# Patient Record
Sex: Female | Born: 1980 | Race: White | Hispanic: No
Health system: Southern US, Community
[De-identification: ages and names within clinical notes are randomized; demographics above are authoritative.]

## PROBLEM LIST (undated history)

## (undated) DIAGNOSIS — F419 Anxiety disorder, unspecified: Secondary | ICD-10-CM

## (undated) DIAGNOSIS — E538 Deficiency of other specified B group vitamins: Secondary | ICD-10-CM

## (undated) HISTORY — DX: Anxiety disorder, unspecified: F41.9

## (undated) HISTORY — DX: Deficiency of other specified B group vitamins: E53.8

---

## 2004-07-18 ENCOUNTER — Other Ambulatory Visit: Admission: RE | Admit: 2004-07-18 | Discharge: 2004-07-18 | Payer: Self-pay | Admitting: Obstetrics and Gynecology

## 2004-09-01 ENCOUNTER — Emergency Department (HOSPITAL_COMMUNITY): Admission: EM | Admit: 2004-09-01 | Discharge: 2004-09-02 | Payer: Self-pay | Admitting: Emergency Medicine

## 2005-08-22 ENCOUNTER — Other Ambulatory Visit: Admission: RE | Admit: 2005-08-22 | Discharge: 2005-08-22 | Payer: Self-pay | Admitting: Obstetrics and Gynecology

## 2006-04-20 IMAGING — CR DG FINGER RING 2+V*L*
2 series · 2 of 2 positions shown · non-contrast
Comparison: None.

CLINICAL DATA: Status post trauma.  
 LEFT RING FINGER ? 3 VIEWS:

[view not recorded (1 of 2)]
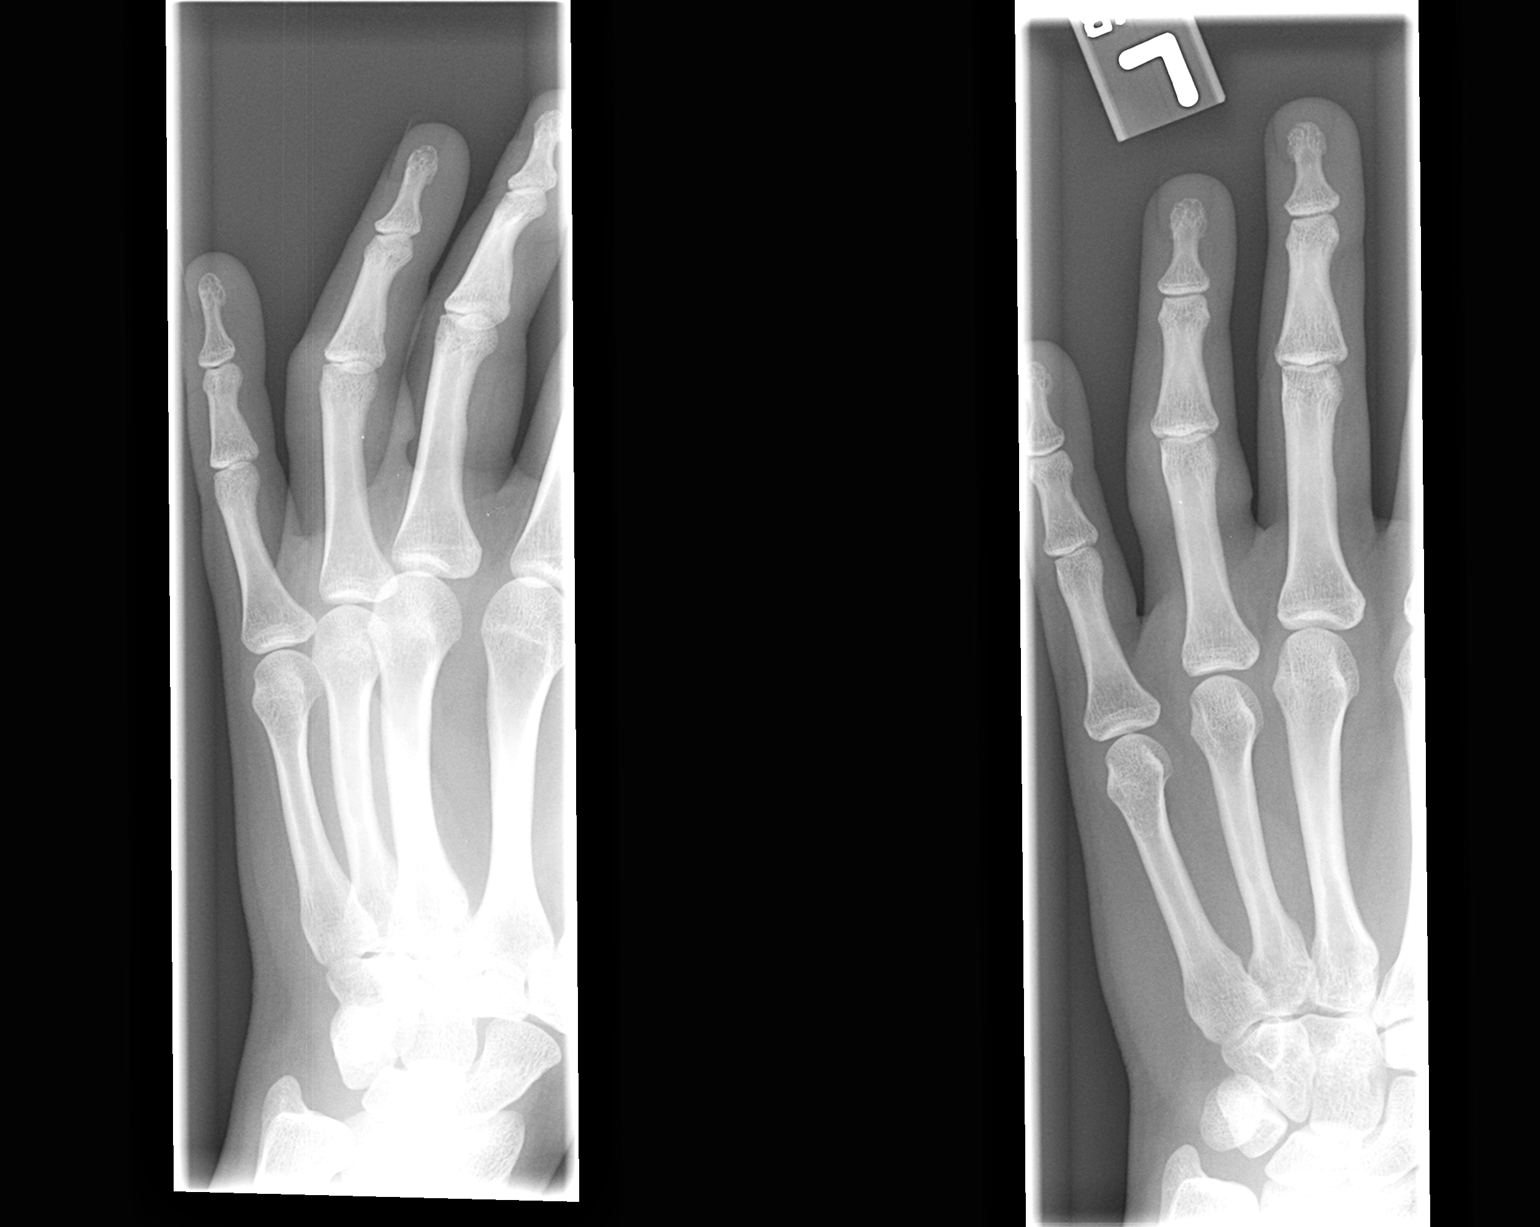

[view not recorded (2 of 2)]
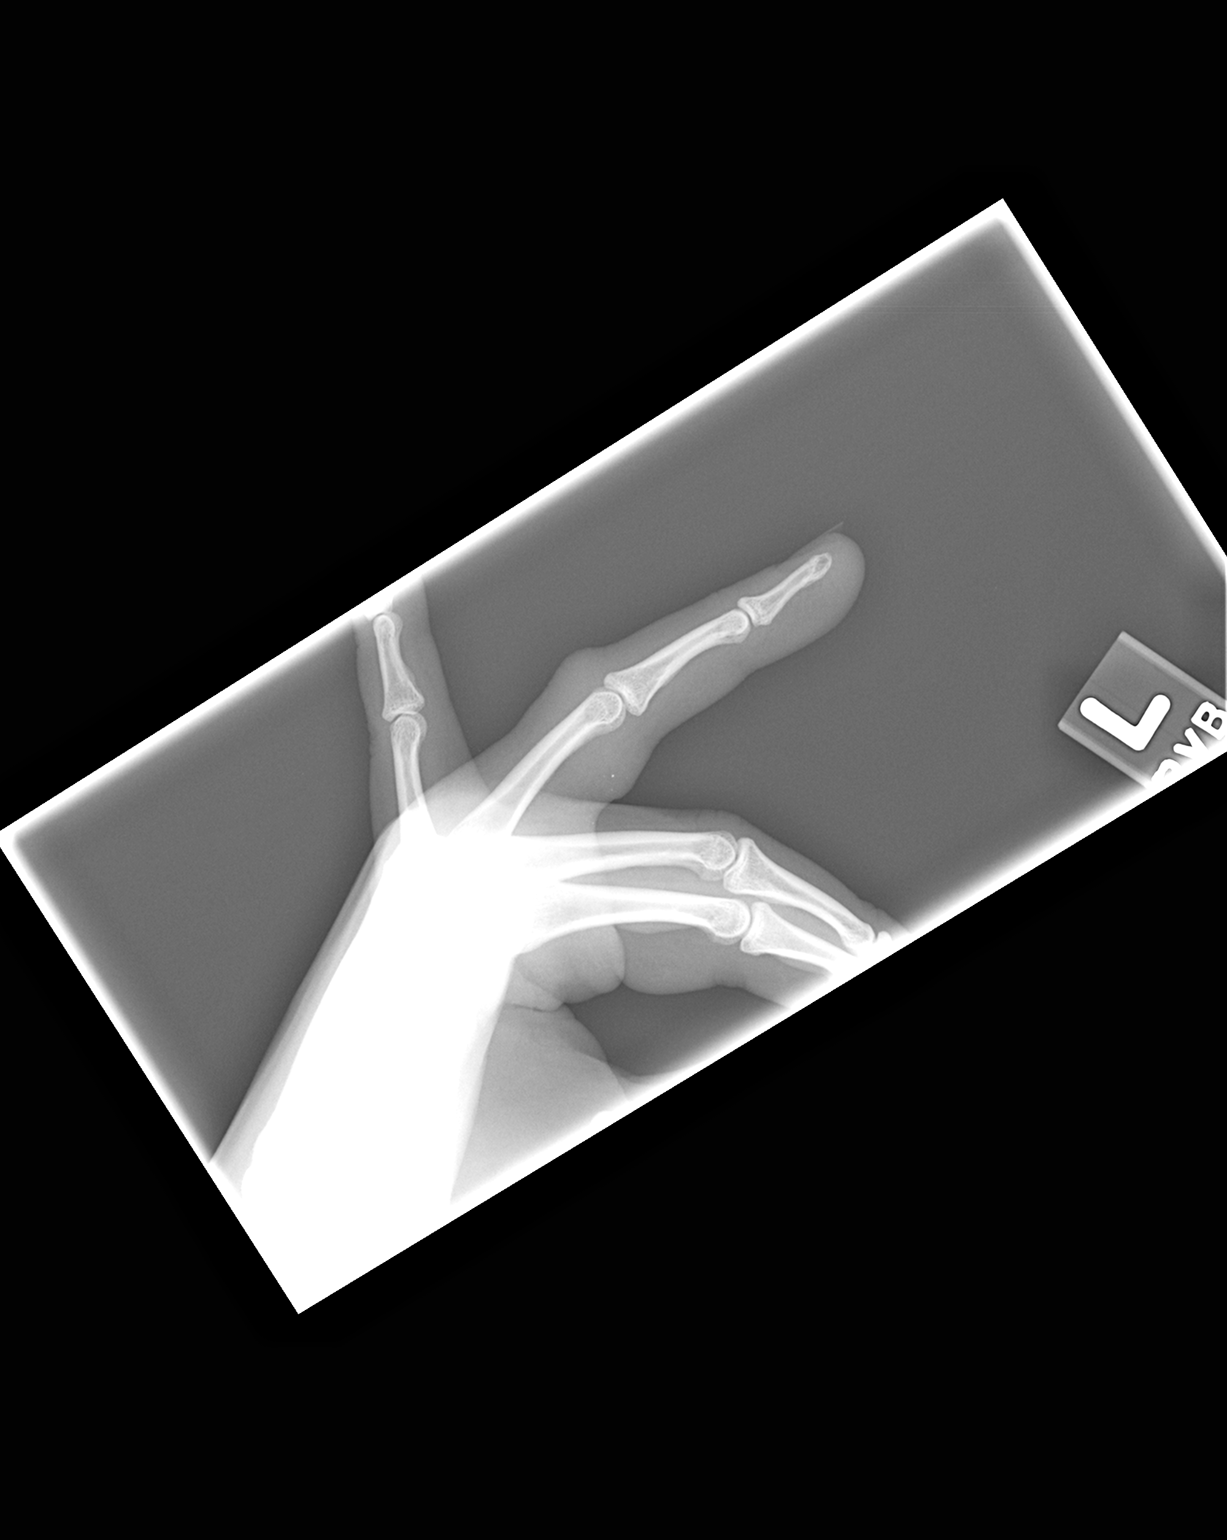

[2 of 2 positions shown; findings below may reference images not displayed]

FINDINGS: There is diffuse soft tissue swelling about the proximal interphalangeal joint of the ring finger.  No underlying fractures or dislocations are identified.
IMPRESSION: Diffuse soft tissue swelling.

## 2007-10-27 ENCOUNTER — Inpatient Hospital Stay (HOSPITAL_COMMUNITY): Admission: AD | Admit: 2007-10-27 | Discharge: 2007-10-30 | Payer: Self-pay | Admitting: Obstetrics and Gynecology

## 2007-10-31 ENCOUNTER — Encounter: Admission: RE | Admit: 2007-10-31 | Discharge: 2007-11-30 | Payer: Self-pay | Admitting: Obstetrics and Gynecology

## 2007-12-01 ENCOUNTER — Encounter: Admission: RE | Admit: 2007-12-01 | Discharge: 2007-12-30 | Payer: Self-pay | Admitting: Obstetrics and Gynecology

## 2007-12-31 ENCOUNTER — Encounter: Admission: RE | Admit: 2007-12-31 | Discharge: 2008-01-30 | Payer: Self-pay | Admitting: Obstetrics and Gynecology

## 2008-01-31 ENCOUNTER — Encounter: Admission: RE | Admit: 2008-01-31 | Discharge: 2008-03-01 | Payer: Self-pay | Admitting: Obstetrics and Gynecology

## 2008-03-02 ENCOUNTER — Encounter: Admission: RE | Admit: 2008-03-02 | Discharge: 2008-03-07 | Payer: Self-pay | Admitting: Obstetrics and Gynecology

## 2009-04-10 ENCOUNTER — Inpatient Hospital Stay (HOSPITAL_COMMUNITY): Admission: RE | Admit: 2009-04-10 | Discharge: 2009-04-12 | Payer: Self-pay | Admitting: Obstetrics and Gynecology

## 2009-04-10 ENCOUNTER — Encounter (INDEPENDENT_AMBULATORY_CARE_PROVIDER_SITE_OTHER): Payer: Self-pay | Admitting: Obstetrics and Gynecology

## 2009-04-13 ENCOUNTER — Encounter: Admission: RE | Admit: 2009-04-13 | Discharge: 2009-05-13 | Payer: Self-pay | Admitting: Obstetrics and Gynecology

## 2009-05-14 ENCOUNTER — Encounter: Admission: RE | Admit: 2009-05-14 | Discharge: 2009-06-08 | Payer: Self-pay | Admitting: Obstetrics and Gynecology

## 2010-04-15 LAB — CBC
HCT: 29 % — ABNORMAL LOW (ref 36.0–46.0)
HCT: 37 % (ref 36.0–46.0)
Hemoglobin: 10.1 g/dL — ABNORMAL LOW (ref 12.0–15.0)
Hemoglobin: 12.8 g/dL (ref 12.0–15.0)
MCV: 90.9 fL (ref 78.0–100.0)
MCV: 92.6 fL (ref 78.0–100.0)
Platelets: 178 10*3/uL (ref 150–400)
WBC: 13.1 10*3/uL — ABNORMAL HIGH (ref 4.0–10.5)

## 2010-04-15 LAB — RPR: RPR Ser Ql: NONREACTIVE

## 2010-06-05 NOTE — Discharge Summary (Signed)
Patty Green, Patty Green                 ACCOUNT NO.:  [id number]   MEDICAL RECORD NO.:  [id number]          PATIENT TYPE:  INP   LOCATION:  9116                          FACILITY:  [facility]   PHYSICIAN:  Zenaida Niece, M.D.DATE OF BIRTH:  [birth year]   DATE OF ADMISSION:  10/27/2007  DATE OF DISCHARGE:  10/30/2007                               DISCHARGE SUMMARY   ADMISSION DIAGNOSIS:  Intrauterine pregnancy at 38 weeks.   DISCHARGE DIAGNOSIS:  Intrauterine pregnancy at 38 weeks.   PROCEDURE:  On October 28, 2007, she had a spontaneous vaginal delivery.   HISTORY AND PHYSICAL:  This is a 30 year old gravida 1, para 0 with an  EGA of 38 plus weeks who presents with complaint of leaking fluid since  0400 on the day of admission.  Evaluation in triage confirmed rupture of  membranes and cervix was 2 cm dilated.  Prenatal care was complicated by  polyhydramnios early, which resolved.   PRENATAL LABS:  Blood type is B positive with negative antibody screen.  Rubella immune.  Hepatitis B surface antigen negative.  RPR nonreactive.  HIV negative.  Gonorrhea and chlamydia negative.  Group B strep is  negative.  Cystic fibrosis negative.  Quad screen is normal.   PAST GYN HISTORY:  History of colposcopy in 2007.   FAMILY HISTORY:  Paternal uncle with spina bifida.   PHYSICAL EXAMINATION:  GENERAL:  She is afebrile with stable vital  signs.  Fetal heart tracing is reactive with irregular contractions.  ABDOMEN:  Gravid, nontender with an estimated fetal weight of 7-1/2  pounds.  Cervix is 2, 80, -1, and ultrasound confirms a vertex  presentation, and she was grossly ruptured.   HOSPITAL COURSE:  The patient was admitted and put on Pitocin.  She  eventually entered active labor and received an epidural.  She  progressed to complete, pushed well, and early on the morning of October 28, 2007, had a vaginal delivery of a viable female infant with Apgars of  8 and 9, weighed 6 pounds 10 ounces.  She  delivered through a loose  nuchal cord x1.  Placenta delivered spontaneously, was intact, and was  sent for cord blood donation.  She had a second-degree laceration,  repaired with 3-0 chromic and 3-0 Vicryl with an estimated blood loss of  500 mL.  Postpartum, she had no significant complications.  She remained  afebrile.  Predelivery hemoglobin 12.4, postdelivery was 8.6.  On  postpartum #2, she was felt to be stable enough for discharge to home.   DISCHARGE INSTRUCTIONS:  Regular diet, pelvic rest, follow up in 6  weeks.   MEDICATIONS:  Over-the-counter ibuprofen as needed and over-the-counter  iron b.i.d., and she is given our discharge pamphlet.      Zenaida Niece, M.D.  Electronically Signed     TDM/MEDQ  D:  10/30/2007  T:  10/30/2007  Job:  [id number]

## 2010-10-23 LAB — CBC
HCT: 37.7
Hemoglobin: 12.4
MCHC: 33.8
MCV: 89.6
Platelets: 167
Platelets: 251
RBC: 2.82 — ABNORMAL LOW

## 2010-10-23 LAB — RPR: RPR Ser Ql: NONREACTIVE

## 2014-10-18 ENCOUNTER — Other Ambulatory Visit: Payer: Self-pay | Admitting: Obstetrics and Gynecology

## 2015-11-28 ENCOUNTER — Other Ambulatory Visit: Payer: Self-pay | Admitting: *Deleted

## 2015-11-28 DIAGNOSIS — D582 Other hemoglobinopathies: Secondary | ICD-10-CM

## 2015-11-29 ENCOUNTER — Encounter: Payer: Self-pay | Admitting: Oncology

## 2015-12-11 ENCOUNTER — Ambulatory Visit (HOSPITAL_BASED_OUTPATIENT_CLINIC_OR_DEPARTMENT_OTHER): Payer: BLUE CROSS/BLUE SHIELD | Admitting: Oncology

## 2015-12-11 ENCOUNTER — Other Ambulatory Visit (HOSPITAL_BASED_OUTPATIENT_CLINIC_OR_DEPARTMENT_OTHER): Payer: BLUE CROSS/BLUE SHIELD

## 2015-12-11 ENCOUNTER — Telehealth: Payer: Self-pay | Admitting: Oncology

## 2015-12-11 VITALS — BP 124/77 | HR 62 | Temp 98.1°F | Resp 18 | Ht 61.0 in | Wt 152.2 lb

## 2015-12-11 DIAGNOSIS — D582 Other hemoglobinopathies: Secondary | ICD-10-CM | POA: Diagnosis not present

## 2015-12-11 DIAGNOSIS — D751 Secondary polycythemia: Secondary | ICD-10-CM | POA: Diagnosis not present

## 2015-12-11 LAB — CBC WITH DIFFERENTIAL/PLATELET
BASO%: 0.4 % (ref 0.0–2.0)
BASOS ABS: 0 10*3/uL (ref 0.0–0.1)
EOS%: 3.3 % (ref 0.0–7.0)
Eosinophils Absolute: 0.2 10*3/uL (ref 0.0–0.5)
HEMATOCRIT: 50.6 % — AB (ref 34.8–46.6)
HGB: 16.8 g/dL — ABNORMAL HIGH (ref 11.6–15.9)
LYMPH#: 2.5 10*3/uL (ref 0.9–3.3)
LYMPH%: 34.8 % (ref 14.0–49.7)
MCH: 29.8 pg (ref 25.1–34.0)
MCHC: 33.3 g/dL (ref 31.5–36.0)
MCV: 89.6 fL (ref 79.5–101.0)
MONO#: 0.5 10*3/uL (ref 0.1–0.9)
MONO%: 6.8 % (ref 0.0–14.0)
NEUT#: 4 10*3/uL (ref 1.5–6.5)
NEUT%: 54.7 % (ref 38.4–76.8)
Platelets: 248 10*3/uL (ref 145–400)
RBC: 5.64 10*6/uL — AB (ref 3.70–5.45)
RDW: 12.5 % (ref 11.2–14.5)
WBC: 7.3 10*3/uL (ref 3.9–10.3)

## 2015-12-11 LAB — CHCC SMEAR

## 2015-12-11 NOTE — Telephone Encounter (Signed)
Appointments scheduled per 11/20 LOS. Patient given AVS report and calendars with future scheduled appointments. °

## 2015-12-11 NOTE — Progress Notes (Signed)
  [facility] New [facility] Consult   Referring MD: Huel CoteKathy Richardson, Md [address] [place], [place] [id number]   Patty Mechanicnna Mcdaris 35 y.o.  Oct 22, 1980    Reason for Referral: Erythrocytosis   HPI: [name] was seen by Dr. Senaida Oresichardson for an annual exam on 11/16/2015. A fingerstick hemoglobin returned elevated at 18.3. A complete CBC from the white count 9.2, hemoglobin 17.9, hematocrit 51.5%, MCV 89, MCHC 34.8, and platelets of 248,000. The absolute neutrophil count returned at 5.8 with an absolute lymphocyte count of 2.4. She reports no previous history of an elevated hemoglobin level. She feels well. No pruritus, rubor, or history of thrombosis.  Past medical history: 1. History of an abnormal Pap smear, CIN 2-status post a Leep procedure 2. G2 P2  Past surgical history: 1. Cesarean section in 2011 Medications: Reviewed  Allergies: No Known Allergies  Family history: No family history of a hematologic condition  Social History:   She lives [place] her [place] and children in [place].  She works at [employer]. She does not use cigarettes. She drinks alcohol on rare occasion. No transfusion history. No risk factor for HIV or hepatitis.   ROS:   Positives include: Sinus/upper respiratory infection when she saw Dr. Senaida Oresichardson in October 2016  A complete ROS was otherwise negative.  Physical Exam:  Blood pressure 124/77, pulse 62, temperature 98.1 F (36.7 C), temperature source Oral, resp. rate 18, height 5\' 1"  (1.549 m), weight 152 lb 3.2 oz (69 kg), SpO2 100 %.  HEENT: Oropharynx without visible mass, neck without mass Lungs: Clear bilaterally Cardiac: Regular rate and rhythm Abdomen: No hepatosplenomegaly, nontender, no mass  Vascular: No leg edema Lymph nodes: No cervical, supraclavicular, axillary, or inguinal nodes Neurologic: Alert and oriented, the motor exam appears intact in the upper and lower extremities Skin: No  rash Musculoskeletal: No spine tenderness   LAB:  CBC  Lab Results  Component Value Date   WBC 7.3 12/11/2015   HGB 16.8 (H) 12/11/2015   HCT 50.6 (H) 12/11/2015   MCV 89.6 12/11/2015   PLT 248 12/11/2015   NEUTROABS 4.0 12/11/2015   Absolute eosinophil count-0.2 Basophils-0.4% Absolute lymphocyte count-2.5     Peripheral blood smear: The platelets are normal in number. No platelet clumps. The white cell morphology is unremarkable. The majority white cells are mature neutrophils. No blasts or other young forms are seen The polychromasia is not increased. The red cell morphology is unremarkable.    Assessment/Plan:   1. Erythrocytosis  Disposition:   [name] is referred for evaluation of erythrocytosis. The hemoglobin and hematocrit are mildly elevated. No evidence for dehydration. There is no apparent secondary cause for erythrocytosis in her case. She does not have symptoms, physical exam findings, or other laboratory evidence of a myeloproliferative disorder.  I discussed the differential diagnosis with her. We will check a JAK-2 mutation, LDH, and erythropoietin level today. She will return for an office visit and CBC in 3 months. We will consider extended mutation testing and further evaluation if she develops a progressive rise in the hematocrit or other hematologic abnormalities.  Approximately 40 minutes were spent with the patient today. The majority of the time was used for counseling and coordination of care.  Thornton PapasSHERRILL, Jartavious Mckimmy, MD  12/11/2015, 3:39 PM

## 2016-03-12 ENCOUNTER — Ambulatory Visit (HOSPITAL_BASED_OUTPATIENT_CLINIC_OR_DEPARTMENT_OTHER): Payer: BLUE CROSS/BLUE SHIELD | Admitting: Oncology

## 2016-03-12 ENCOUNTER — Other Ambulatory Visit (HOSPITAL_BASED_OUTPATIENT_CLINIC_OR_DEPARTMENT_OTHER): Payer: BLUE CROSS/BLUE SHIELD

## 2016-03-12 VITALS — BP 115/77 | HR 63 | Temp 98.6°F | Resp 18 | Ht 61.0 in | Wt 154.7 lb

## 2016-03-12 DIAGNOSIS — D751 Secondary polycythemia: Secondary | ICD-10-CM

## 2016-03-12 LAB — CBC WITH DIFFERENTIAL/PLATELET
BASO%: 0.2 % (ref 0.0–2.0)
BASOS ABS: 0 10*3/uL (ref 0.0–0.1)
EOS ABS: 0.3 10*3/uL (ref 0.0–0.5)
EOS%: 2.7 % (ref 0.0–7.0)
HCT: 42.9 % (ref 34.8–46.6)
HGB: 15.1 g/dL (ref 11.6–15.9)
LYMPH%: 34.7 % (ref 14.0–49.7)
MCH: 30.9 pg (ref 25.1–34.0)
MCHC: 35.2 g/dL (ref 31.5–36.0)
MCV: 87.9 fL (ref 79.5–101.0)
MONO#: 0.8 10*3/uL (ref 0.1–0.9)
MONO%: 8 % (ref 0.0–14.0)
NEUT#: 5.1 10*3/uL (ref 1.5–6.5)
NEUT%: 54.4 % (ref 38.4–76.8)
PLATELETS: 235 10*3/uL (ref 145–400)
RBC: 4.88 10*6/uL (ref 3.70–5.45)
RDW: 12.5 % (ref 11.2–14.5)
WBC: 9.4 10*3/uL (ref 3.9–10.3)
lymph#: 3.3 10*3/uL (ref 0.9–3.3)

## 2016-03-12 NOTE — Progress Notes (Signed)
  [facility] OFFICE PROGRESS NOTE   Diagnosis: Erythrocytosis  INTERVAL HISTORY:   [name] returns as scheduled. She had a "GI bug" a few weeks ago followed by sore throat and cough. The cough persists. No fever or dyspnea. No leg swelling or pain. She denies bleeding and symptoms of thrombosis. No pruritus.  Objective:  Vital signs in last 24 hours:  Blood pressure 115/77, pulse 63, temperature 98.6 F (37 C), temperature source Oral, resp. rate 18, height 5\' 1"  (1.549 m), weight 154 lb 11.2 oz (70.2 kg), SpO2 100 %.   Resp: Mild bilateral end inspiratory wheeze and expiratory rhonchi, no respiratory distress Cardio: Regular rate and rhythm GI: No hepatosplenomegaly Vascular: No leg edema    Lab Results:  Lab Results  Component Value Date   WBC 9.4 03/12/2016   HGB 15.1 03/12/2016   HCT 42.9 03/12/2016   MCV 87.9 03/12/2016   PLT 235 03/12/2016   NEUTROABS 5.1 03/12/2016     Medications: I have reviewed the patient's current medications.  Assessment/Plan: 1. History of Erythrocytosis   Disposition:  [name] was referred last fall for evaluation of an elevated hemoglobin and hematocrit. There was no clinical evidence for a myeloproliferative disorder. We had planned to check a JAK-2 mutation and LDH, but these laboratory studies were not performed. The hemoglobin and hematocrit are normal today. I do not recommend additional testing. I have a low clinical suspicion for a myeloproliferative disorder.  I recommend a CBC be checked at the time of her yearly examination with Dr. Senaida Oresichardson and Dr. Foy GuadalajaraFried. I am available to see her again if the hemoglobin/hematocrit returned elevated.  She will [facility] with [facility] if the cough does not resolve soon.  She is not scheduled for a follow-up appointment in the hematology clinic. I am available to see her in the future as needed.  15 minutes were spent with the patient today. The majority of  the time was used for counseling and coordination of care.  Thornton PapasSHERRILL, Aristide Waggle, MD  03/12/2016  3:56 PM

## 2019-04-15 ENCOUNTER — Telehealth: Payer: Self-pay | Admitting: Nurse Practitioner

## 2019-04-15 ENCOUNTER — Encounter: Payer: Self-pay | Admitting: Nurse Practitioner

## 2019-04-15 NOTE — Telephone Encounter (Signed)
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 Pt has previously seen Dr. Truett Perna in 2018. Pt has been scheduled to see Lonna Cobb on 4/6 at 245pm. I lft the pt a vm to return my call. Letter mailed.

## 2019-04-19 ENCOUNTER — Telehealth: Payer: Self-pay | Admitting: Nurse Practitioner

## 2019-04-19 NOTE — Telephone Encounter (Signed)
Pt cld back to confirm appt w/Lisa on 4/6 at 245pm.

## 2019-04-27 ENCOUNTER — Inpatient Hospital Stay: Payer: BC Managed Care – PPO

## 2019-04-27 ENCOUNTER — Encounter: Payer: Self-pay | Admitting: Nurse Practitioner

## 2019-04-27 ENCOUNTER — Other Ambulatory Visit: Payer: Self-pay

## 2019-04-27 ENCOUNTER — Inpatient Hospital Stay: Payer: BC Managed Care – PPO | Attending: Nurse Practitioner | Admitting: Nurse Practitioner

## 2019-04-27 VITALS — BP 124/92 | HR 75 | Temp 98.6°F | Resp 20 | Ht 61.0 in | Wt 161.7 lb

## 2019-04-27 DIAGNOSIS — D751 Secondary polycythemia: Secondary | ICD-10-CM | POA: Diagnosis present

## 2019-04-27 LAB — LACTATE DEHYDROGENASE: LDH: 154 U/L (ref 98–192)

## 2019-04-27 LAB — CBC WITH DIFFERENTIAL (CANCER CENTER ONLY)
Abs Immature Granulocytes: 0.02 K/uL (ref 0.00–0.07)
Basophils Absolute: 0 K/uL (ref 0.0–0.1)
Basophils Relative: 0 %
Eosinophils Absolute: 0.2 K/uL (ref 0.0–0.5)
Eosinophils Relative: 2 %
HCT: 49.4 % — ABNORMAL HIGH (ref 36.0–46.0)
Hemoglobin: 16.9 g/dL — ABNORMAL HIGH (ref 12.0–15.0)
Immature Granulocytes: 0 %
Lymphocytes Relative: 26 %
Lymphs Abs: 2.5 K/uL (ref 0.7–4.0)
MCH: 31.8 pg (ref 26.0–34.0)
MCHC: 34.2 g/dL (ref 30.0–36.0)
MCV: 92.9 fL (ref 80.0–100.0)
Monocytes Absolute: 0.6 K/uL (ref 0.1–1.0)
Monocytes Relative: 6 %
Neutro Abs: 6.1 K/uL (ref 1.7–7.7)
Neutrophils Relative %: 66 %
Platelet Count: 268 K/uL (ref 150–400)
RBC: 5.32 MIL/uL — ABNORMAL HIGH (ref 3.87–5.11)
RDW: 11.7 % (ref 11.5–15.5)
WBC Count: 9.5 K/uL (ref 4.0–10.5)
nRBC: 0 % (ref 0.0–0.2)

## 2019-04-27 LAB — SAVE SMEAR (SSMR)

## 2019-04-27 NOTE — Progress Notes (Addendum)
  [facility] OFFICE PROGRESS NOTE   Diagnosis: History of erythrocytosis  INTERVAL HISTORY:   Ms. Curnow was last seen by Dr. Truett Perna 02/23/2016 for follow-up of erythrocytosis.  CBC at that time showed hemoglobin and hematocrit in normal range.  No further evaluation was pursued.  Annual CBC recommended.  CBC on 04/09/2019 showed a hemoglobin of 17.6, hematocrit 51.8.  She was referred back to the Cancer [facility].  She feels well.  No symptoms of thrombosis.  No bleeding.  No nausea or vomiting.  Bowels moving regularly.  She has a good appetite.  No fevers or sweats.  She does not smoke.  No shortness of breath.  She is not aware of anyone in her family having an elevated hemoglobin or a bone marrow disorder.  Objective:  Vital signs in last 24 hours:  Blood pressure (!) 124/92, pulse 75, temperature 98.6 F (37 C), temperature source Temporal, resp. rate 20, height 5\' 1"  (1.549 m), weight 161 lb 11.2 oz (73.3 kg), SpO2 100 %.    HEENT: Neck without mass. Lymphatics: No palpable cervical, supraclavicular or axillary lymph nodes. Resp: Lungs clear bilaterally. Cardio: Regular rate and rhythm. GI: Abdomen soft and nontender.  No hepatosplenomegaly. Vascular: No leg edema.  Calves soft and nontender.    Lab Results:  Lab Results  Component Value Date   WBC 9.5 04/27/2019   HGB 16.9 (H) 04/27/2019   HCT 49.4 (H) 04/27/2019   MCV 92.9 04/27/2019   PLT 268 04/27/2019   NEUTROABS 6.1 04/27/2019    Imaging:  No results found.  Medications: I have reviewed the patient's current medications.  Assessment/Plan: 1. History of erythrocytosis  Disposition: Ms. Faivre has a history of erythrocytosis.  We obtained a repeat CBC today.  Hemoglobin/hematocrit elevated, similar to values from 12/11/2015.  White count and platelets in normal range.  We discussed primary and secondary erythrocytosis.  JAK2, LDH and erythropoietin levels are pending.  She will return for a  follow-up CBC in 6 weeks.  Plan for CBC and next appointment in 3 months.  She will contact the office in the interim with any problems.  Patient seen with Dr. 12/13/2015.    Truett Perna ANP/GNP-BC   04/27/2019  4:14 PM This was a shared visit with 06/27/2019.  Ms. Weldin was interviewed and examined.  She has a history of erythrocytosis.  The hemoglobin has not changed significantly over the past 3.5 years.  The etiology of the erythrocytosis is unclear.  I have a low clinical suspicion for polycythemia vera based on the stability of the hemoglobin, normal white count/platelets, and lack of symptoms.  She does not have a history to suggest secondary erythrocytosis due to cardiopulmonary disease.  She does not appear dehydrated or take medications which would cause volume contraction.  We obtained additional diagnostic evaluation today.  The plan is to monitor the hemoglobin for now.  Johna Sheriff, MD

## 2019-04-28 LAB — ERYTHROPOIETIN: Erythropoietin: 3.9 m[IU]/mL (ref 2.6–18.5)

## 2019-04-29 ENCOUNTER — Telehealth: Payer: Self-pay | Admitting: Nurse Practitioner

## 2019-04-29 NOTE — Telephone Encounter (Signed)
Scheduled appt per 4/6 los.  Spoke with pt and she is aware of the appt date and time.

## 2019-05-06 ENCOUNTER — Telehealth: Payer: Self-pay

## 2019-05-06 LAB — JAK2 (INCLUDING V617F AND EXON 12), MPL,& CALR W/RFL MPN PANEL (NGS)

## 2019-05-06 NOTE — Telephone Encounter (Signed)
TC to Pt. Per Roxan Diesel NP relayed message to Pt. About lab results and follow up. Pt. Verbalized understanding, No further problems or concerns noted.

## 2019-05-06 NOTE — Telephone Encounter (Signed)
-----   Message from Rana Snare, NP sent at 05/06/2019  8:51 AM EDT ----- Please let her know labs we obtained at time of her last visit look good. No evidence of a myeloproliferative disorder. Elevated Hb may be a normal variant. F/u as scheduled.

## 2019-06-08 ENCOUNTER — Encounter: Payer: Self-pay | Admitting: *Deleted

## 2019-06-08 ENCOUNTER — Telehealth: Payer: Self-pay | Admitting: Oncology

## 2019-06-08 ENCOUNTER — Inpatient Hospital Stay: Payer: BC Managed Care – PPO

## 2019-06-08 NOTE — Telephone Encounter (Signed)
Scheduled appt per 5/18 sch message - unable to reach pt - left message with appt date and time   

## 2019-06-08 NOTE — Progress Notes (Signed)
Patient left message with after hours [employer] that she needs to reschedule her lab of 5/18 (not feeling well). Sent scheduling message to reschedule for week of 06/14/19

## 2019-06-15 ENCOUNTER — Inpatient Hospital Stay: Payer: BC Managed Care – PPO | Attending: Nurse Practitioner

## 2019-06-15 ENCOUNTER — Other Ambulatory Visit: Payer: Self-pay

## 2019-06-15 DIAGNOSIS — D751 Secondary polycythemia: Secondary | ICD-10-CM | POA: Insufficient documentation

## 2019-06-15 LAB — CBC WITH DIFFERENTIAL (CANCER CENTER ONLY)
Abs Immature Granulocytes: 0.04 10*3/uL (ref 0.00–0.07)
Basophils Absolute: 0 10*3/uL (ref 0.0–0.1)
Basophils Relative: 1 %
Eosinophils Absolute: 0.2 10*3/uL (ref 0.0–0.5)
Eosinophils Relative: 2 %
HCT: 52.5 % — ABNORMAL HIGH (ref 36.0–46.0)
Hemoglobin: 17.6 g/dL — ABNORMAL HIGH (ref 12.0–15.0)
Immature Granulocytes: 1 %
Lymphocytes Relative: 29 %
Lymphs Abs: 1.8 10*3/uL (ref 0.7–4.0)
MCH: 31.5 pg (ref 26.0–34.0)
MCHC: 33.5 g/dL (ref 30.0–36.0)
MCV: 94.1 fL (ref 80.0–100.0)
Monocytes Absolute: 0.4 10*3/uL (ref 0.1–1.0)
Monocytes Relative: 6 %
Neutro Abs: 3.9 10*3/uL (ref 1.7–7.7)
Neutrophils Relative %: 61 %
Platelet Count: 258 10*3/uL (ref 150–400)
RBC: 5.58 MIL/uL — ABNORMAL HIGH (ref 3.87–5.11)
RDW: 12 % (ref 11.5–15.5)
WBC Count: 6.3 10*3/uL (ref 4.0–10.5)
nRBC: 0 % (ref 0.0–0.2)

## 2019-07-27 ENCOUNTER — Inpatient Hospital Stay: Payer: BC Managed Care – PPO | Admitting: Oncology

## 2019-07-27 ENCOUNTER — Inpatient Hospital Stay: Payer: BC Managed Care – PPO

## 2019-07-30 ENCOUNTER — Telehealth: Payer: Self-pay | Admitting: Oncology

## 2019-07-30 ENCOUNTER — Inpatient Hospital Stay (HOSPITAL_BASED_OUTPATIENT_CLINIC_OR_DEPARTMENT_OTHER): Payer: BC Managed Care – PPO | Admitting: Oncology

## 2019-07-30 ENCOUNTER — Other Ambulatory Visit: Payer: Self-pay

## 2019-07-30 ENCOUNTER — Inpatient Hospital Stay: Payer: BC Managed Care – PPO | Attending: Nurse Practitioner

## 2019-07-30 VITALS — BP 124/86 | HR 67 | Temp 97.8°F | Resp 16 | Ht 61.0 in | Wt 159.0 lb

## 2019-07-30 DIAGNOSIS — D751 Secondary polycythemia: Secondary | ICD-10-CM | POA: Diagnosis present

## 2019-07-30 LAB — CBC WITH DIFFERENTIAL (CANCER CENTER ONLY)
Abs Immature Granulocytes: 0.03 10*3/uL (ref 0.00–0.07)
Basophils Absolute: 0 10*3/uL (ref 0.0–0.1)
Basophils Relative: 0 %
Eosinophils Absolute: 0.2 10*3/uL (ref 0.0–0.5)
Eosinophils Relative: 4 %
HCT: 52.5 % — ABNORMAL HIGH (ref 36.0–46.0)
Hemoglobin: 17.7 g/dL — ABNORMAL HIGH (ref 12.0–15.0)
Immature Granulocytes: 1 %
Lymphocytes Relative: 32 %
Lymphs Abs: 2.1 10*3/uL (ref 0.7–4.0)
MCH: 31.6 pg (ref 26.0–34.0)
MCHC: 33.7 g/dL (ref 30.0–36.0)
MCV: 93.8 fL (ref 80.0–100.0)
Monocytes Absolute: 0.4 10*3/uL (ref 0.1–1.0)
Monocytes Relative: 7 %
Neutro Abs: 3.8 10*3/uL (ref 1.7–7.7)
Neutrophils Relative %: 56 %
Platelet Count: 238 10*3/uL (ref 150–400)
RBC: 5.6 MIL/uL — ABNORMAL HIGH (ref 3.87–5.11)
RDW: 12.6 % (ref 11.5–15.5)
WBC Count: 6.6 10*3/uL (ref 4.0–10.5)
nRBC: 0 % (ref 0.0–0.2)

## 2019-07-30 NOTE — Progress Notes (Signed)
  [facility] OFFICE PROGRESS NOTE   Diagnosis: Erythrocytosis  INTERVAL HISTORY:   [name] returns as scheduled. She feels well. No complaint. No dyspnea, pruritus, bleeding, or symptom of thrombosis. She is working at an outdoor nursery.  Objective:  Vital signs in last 24 hours:  Blood pressure 124/86, pulse 67, temperature 97.8 F (36.6 C), temperature source Temporal, resp. rate 16, height 5\' 1"  (1.549 m), weight 159 lb (72.1 kg), SpO2 100 %.    Resp: Lungs clear bilaterally Cardio: Regular rate and rhythm GI: No hepatosplenomegaly Vascular: No leg edema   Lab Results:  Lab Results  Component Value Date   WBC 6.6 07/30/2019   HGB 17.7 (H) 07/30/2019   HCT 52.5 (H) 07/30/2019   MCV 93.8 07/30/2019   PLT 238 07/30/2019   NEUTROABS 3.8 07/30/2019     Medications: I have reviewed the patient's current medications.   Assessment/Plan: 1. History of erythrocytosis  Negative myeloproliferative panel  Appropriately low erythropoietin level    Disposition: [name] has stable erythrocytosis. I have a low clinical suspicion for a myeloproliferative disorder. The white count and platelets are normal and she had a negative myeloproliferative panel in April. The erythropoietin level was appropriately low. There is no family history of erythrocytosis.  She has idiopathic erythrocytosis. The plan is to continue observation. She will call for new symptoms. [name] will return for an office visit and CBC in approximately 6 months.  Johna Sheriff, MD  07/30/2019  8:11 AM

## 2019-07-30 NOTE — Telephone Encounter (Signed)
Scheduled per los. Gave avs and calendar  

## 2020-01-10 ENCOUNTER — Ambulatory Visit: Payer: BC Managed Care – PPO | Admitting: Nurse Practitioner

## 2020-01-10 ENCOUNTER — Other Ambulatory Visit: Payer: BC Managed Care – PPO

## 2022-02-14 DIAGNOSIS — M898X1 Other specified disorders of bone, shoulder: Secondary | ICD-10-CM | POA: Diagnosis not present

## 2022-02-14 DIAGNOSIS — M542 Cervicalgia: Secondary | ICD-10-CM | POA: Diagnosis not present

## 2022-09-05 DIAGNOSIS — Z01419 Encounter for gynecological examination (general) (routine) without abnormal findings: Secondary | ICD-10-CM | POA: Diagnosis not present

## 2022-09-05 DIAGNOSIS — Z1231 Encounter for screening mammogram for malignant neoplasm of breast: Secondary | ICD-10-CM | POA: Diagnosis not present

## 2022-09-10 ENCOUNTER — Other Ambulatory Visit: Payer: Self-pay | Admitting: Obstetrics and Gynecology

## 2022-09-10 DIAGNOSIS — R928 Other abnormal and inconclusive findings on diagnostic imaging of breast: Secondary | ICD-10-CM

## 2022-09-24 ENCOUNTER — Other Ambulatory Visit: Payer: Self-pay | Admitting: Obstetrics and Gynecology

## 2022-09-24 ENCOUNTER — Ambulatory Visit
Admission: RE | Admit: 2022-09-24 | Discharge: 2022-09-24 | Disposition: A | Discharge status: Home / Self Care | Payer: BC Managed Care – PPO | Source: Ambulatory Visit | Attending: Obstetrics and Gynecology | Admitting: Obstetrics and Gynecology

## 2022-09-24 DIAGNOSIS — R928 Other abnormal and inconclusive findings on diagnostic imaging of breast: Secondary | ICD-10-CM

## 2022-09-24 DIAGNOSIS — N6311 Unspecified lump in the right breast, upper outer quadrant: Secondary | ICD-10-CM | POA: Diagnosis not present

## 2022-09-24 DIAGNOSIS — N6310 Unspecified lump in the right breast, unspecified quadrant: Secondary | ICD-10-CM

## 2023-03-28 ENCOUNTER — Ambulatory Visit
Admission: RE | Admit: 2023-03-28 | Discharge: 2023-03-28 | Disposition: A | Discharge status: Home / Self Care | Payer: BC Managed Care – PPO | Source: Ambulatory Visit | Attending: Obstetrics and Gynecology | Admitting: Obstetrics and Gynecology

## 2023-03-28 ENCOUNTER — Other Ambulatory Visit: Payer: Self-pay | Admitting: Obstetrics and Gynecology

## 2023-03-28 DIAGNOSIS — N6310 Unspecified lump in the right breast, unspecified quadrant: Secondary | ICD-10-CM

## 2023-03-28 DIAGNOSIS — N6311 Unspecified lump in the right breast, upper outer quadrant: Secondary | ICD-10-CM | POA: Diagnosis not present

## 2023-04-03 ENCOUNTER — Ambulatory Visit
Admission: RE | Admit: 2023-04-03 | Discharge: 2023-04-03 | Disposition: A | Discharge status: Home / Self Care | Source: Ambulatory Visit | Attending: Obstetrics and Gynecology | Admitting: Obstetrics and Gynecology

## 2023-04-03 DIAGNOSIS — N6311 Unspecified lump in the right breast, upper outer quadrant: Secondary | ICD-10-CM | POA: Diagnosis not present

## 2023-04-03 DIAGNOSIS — N6310 Unspecified lump in the right breast, unspecified quadrant: Secondary | ICD-10-CM

## 2023-04-03 DIAGNOSIS — D241 Benign neoplasm of right breast: Secondary | ICD-10-CM | POA: Diagnosis not present

## 2023-04-03 HISTORY — PX: BREAST BIOPSY: SHX20

## 2023-04-04 LAB — SURGICAL PATHOLOGY

## 2023-09-10 DIAGNOSIS — Z01411 Encounter for gynecological examination (general) (routine) with abnormal findings: Secondary | ICD-10-CM | POA: Diagnosis not present

## 2023-09-10 DIAGNOSIS — Z01419 Encounter for gynecological examination (general) (routine) without abnormal findings: Secondary | ICD-10-CM | POA: Diagnosis not present

## 2023-09-10 DIAGNOSIS — Z1231 Encounter for screening mammogram for malignant neoplasm of breast: Secondary | ICD-10-CM | POA: Diagnosis not present

## 2023-09-10 DIAGNOSIS — N951 Menopausal and female climacteric states: Secondary | ICD-10-CM | POA: Diagnosis not present

## 2023-09-10 DIAGNOSIS — Z130 Encounter for screening for diseases of the blood and blood-forming organs and certain disorders involving the immune mechanism: Secondary | ICD-10-CM | POA: Diagnosis not present

## 2023-10-02 ENCOUNTER — Encounter (INDEPENDENT_AMBULATORY_CARE_PROVIDER_SITE_OTHER): Payer: Self-pay

## 2023-11-04 DIAGNOSIS — Z0289 Encounter for other administrative examinations: Secondary | ICD-10-CM

## 2023-11-04 NOTE — Progress Notes (Unsigned)
 [address] [place], [place], [place] [address] Office: [phone]  /  Fax: [phone]   Initial Consultation    Patty Green was seen in clinic today to evaluate for obesity. She is interested in losing weight to improve overall health and reduce the risk of weight related complications. She presents today to review program treatment options, initial physical assessment, and evaluation.    Anthropometrics and Bioimpedance Analysis   There is no height or weight on file to calculate BMI. Body Fat Mass : *** % Visceral Fat Mass Rating : *** Waist to Height Ratio: ***  Obesity Related Diseases and Complications  Obesity Quality of Life and Psychosocial Complications: {emqolpsychosoc:33006::Reduced health-related quality of life}  Cardiometabolic: {emcardiometabolic complications:33007}  Biomechanical: {embiomechanical:33008}   Weight Related History  She was referred by: {emreferby:28303}  When asked what they would like to accomplish? She states: {EMHopetoaccomplish:28304::Adopt a healthier eating pattern and lifestyle,Improve energy levels and physical activity,Improve existing medical conditions,Improve quality of life}  Weight history: ***  Highest weight: ***  Contributing factors: {EMcontributingfactors:28307}  Prior weight loss attempts: {emweightlossprograms:31590::None}  Current or previous pharmacotherapy: {EM previousRx:28311}  Response to medication: {EMResponsetomedication:28312}  Current nutrition plan: {EMNutritionplan:28309::None}  Greatest challenge with dieting: {emgreatestchallengediet:31593}.  Current level of physical activity: {EMcurrentPA:28310::None}  Barriers to Exercise: {embarrierstoexercise:32606::no barriers}  Readiness and Motivation  On a scale from 0 to 10 How ready are you to make changes to your eating and physical activity to lose weight? {NUMBER 1-10:22536} How important is it for you to lose weight right now  ? {NUMBER 1-10:22536} How confident are you that you can lose weight if you try? {NUMBER [id number]  Past Medical History  No past medical history on file.   Objective    There were no vitals taken for this visit. She was weighed on the bioimpedance scale: There is no height or weight on file to calculate BMI.    General:  Alert, oriented and cooperative. Patient is in no acute distress.  Respiratory: Normal respiratory effort, no problems with respiration noted   Gait: able to ambulate independently  Mental Status: Normal mood and affect. Normal behavior. Normal judgment and thought content.   Diagnostic Data Reviewed  BMET No results found for: NA, K, CL, CO2, GLUCOSE, BUN, CREATININE, CALCIUM, GFRNONAA, GFRAA No results found for: HGBA1C No results found for: INSULIN CBC    Component Value Date/Time   WBC 6.6 07/30/2019 0735   WBC 9.4 03/12/2016 1454   WBC 13.1 (H) 04/11/2009 0555   RBC 5.60 (H) 07/30/2019 0735   HGB 17.7 (H) 07/30/2019 0735   HGB 15.1 03/12/2016 1454   HCT 52.5 (H) 07/30/2019 0735   HCT 42.9 03/12/2016 1454   PLT 238 07/30/2019 0735   PLT 235 03/12/2016 1454   MCV 93.8 07/30/2019 0735   MCV 87.9 03/12/2016 1454   MCH 31.6 07/30/2019 0735   MCHC 33.7 07/30/2019 0735   RDW 12.6 07/30/2019 0735   RDW 12.5 03/12/2016 1454   Iron/TIBC/Ferritin/ %Sat No results found for: IRON, TIBC, FERRITIN, IRONPCTSAT Lipid Panel  No results found for: CHOL, TRIG, HDL, CHOLHDL, VLDL, LDLCALC, LDLDIRECT Hepatic Function Panel  No results found for: PROT, ALBUMIN, AST, ALT, ALKPHOS, BILITOT, BILIDIR, IBILI No results found for: TSH  Medications  Outpatient Encounter Medications as of 11/05/2023  Medication Sig Note   Cholecalciferol 10 MCG (400 UNIT) CAPS Take 10 mcg by mouth daily.    levonorgestrel (MIRENA, 52 MG,) 20 MCG/24HR IUD Mirena 20 mcg/24 hours (6 yrs) 52 mg intrauterine device  Take 1  device every day by intrauterine route. 07/30/2019: Placed 2021   Multiple Vitamin (MULTIVITAMIN) tablet Take 1 tablet by mouth daily.    No facility-administered encounter medications on file as of 11/05/2023.     Assessment and Plan   There are no diagnoses linked to this encounter.     Obesity Treatment and Action Plan:  {EMobesityactionplanscribe:28314::Patient will work on garnering support from family and friends to begin weight loss journey.,Will work on eliminating or reducing the presence of highly palatable, calorie dense foods in the home.,Will complete provided nutritional and psychosocial assessment questionnaire before the next appointment.,Will be scheduled for indirect calorimetry to determine resting energy expenditure in a fasting state.  This will allow us  to create a reduced calorie, high-protein meal plan to promote loss of fat mass while preserving muscle mass.,Counseled on the health benefits of losing 5%-15% of total body weight.,Was counseled on nutritional approaches to weight loss and benefits of reducing processed foods and consuming plant-based foods and high quality protein as part of nutritional weight management.,Was counseled on pharmacotherapy and role as an adjunct in weight management. }  Education and Additional resources  She was weighed on the bioimpedance scale and results were discussed and documented in the synopsis.  We discussed obesity as a progressive, chronic disease and the importance of a more detailed evaluation of all the factors contributing to the disease.  We reviewed the basic principles in obesity management.   We discussed the importance of long term lifestyle changes which include nutrition, exercise and behavioral modification as well as the importance of customizing this to her specific health and social needs.  We reviewed the role of medical interventions including pharmacotherapy and surgical interventions.   We  discussed the benefits of reaching a healthier weight to alleviate the symptoms of existing conditions and reduce the risks of the biomechanical, cardiometabolic and psychological effects of obesity.  We reviewed our program approach and philosophy, which are guided by the four pillars of obesity medicine.  We discussed how to prepare for intake appointment and the importance of fasting and avoidance of stimulants for at least 8 hours prior to indirect calorimetry.  Akayla Brass appears to be in the action stage of change and reports being ready to initiate intensive lifestyle and behavioral modifications as part of their weight loss journey.  Attestation  Reviewed by clinician on day of visit: allergies, medications, problem list, medical history, surgical history, family history, social history, and previous encounter notes pertinent to obesity diagnosis.  I have spent *** minutes in the care of the patient today including: {NUMBER 1-10:22536} minutes before the visit reviewing and preparing the chart. *** minutes face-to-face {emfacetoface:32598::assessing and reviewing listed medical problems as outlined in obesity care plan,providing nutritional and behavioral counseling on topics outlined in the obesity care plan,independently interpreting test results and goals of care, as described in assessment and plan,reviewing and discussing biometric information and progress} {NUMBER 1-10:22536} minutes after the visit updating chart and documentation of encounter.  Alzora Ha ANP-C

## 2023-11-05 ENCOUNTER — Encounter (INDEPENDENT_AMBULATORY_CARE_PROVIDER_SITE_OTHER): Payer: Self-pay | Admitting: Nurse Practitioner

## 2023-11-05 ENCOUNTER — Ambulatory Visit (INDEPENDENT_AMBULATORY_CARE_PROVIDER_SITE_OTHER): Admitting: Nurse Practitioner

## 2023-11-05 VITALS — BP 117/82 | HR 73 | Ht 62.0 in | Wt 175.0 lb

## 2023-11-05 DIAGNOSIS — E66811 Obesity, class 1: Secondary | ICD-10-CM | POA: Diagnosis not present

## 2023-11-05 DIAGNOSIS — Z6832 Body mass index (BMI) 32.0-32.9, adult: Secondary | ICD-10-CM | POA: Diagnosis not present

## 2023-11-05 DIAGNOSIS — D751 Secondary polycythemia: Secondary | ICD-10-CM

## 2023-11-26 ENCOUNTER — Ambulatory Visit (INDEPENDENT_AMBULATORY_CARE_PROVIDER_SITE_OTHER): Payer: Self-pay | Admitting: Nurse Practitioner

## 2023-11-26 ENCOUNTER — Encounter (INDEPENDENT_AMBULATORY_CARE_PROVIDER_SITE_OTHER): Payer: Self-pay | Admitting: Nurse Practitioner

## 2023-11-26 VITALS — BP 118/78 | HR 78 | Temp 98.1°F | Ht 62.0 in | Wt 177.0 lb

## 2023-11-26 DIAGNOSIS — Z1331 Encounter for screening for depression: Secondary | ICD-10-CM

## 2023-11-26 DIAGNOSIS — R0602 Shortness of breath: Secondary | ICD-10-CM | POA: Diagnosis not present

## 2023-11-26 DIAGNOSIS — E66811 Obesity, class 1: Secondary | ICD-10-CM

## 2023-11-26 DIAGNOSIS — D751 Secondary polycythemia: Secondary | ICD-10-CM | POA: Diagnosis not present

## 2023-11-26 DIAGNOSIS — R5383 Other fatigue: Secondary | ICD-10-CM

## 2023-11-26 DIAGNOSIS — E559 Vitamin D deficiency, unspecified: Secondary | ICD-10-CM

## 2023-11-26 DIAGNOSIS — Z6832 Body mass index (BMI) 32.0-32.9, adult: Secondary | ICD-10-CM

## 2023-11-26 DIAGNOSIS — T733XXA Exhaustion due to excessive exertion, initial encounter: Secondary | ICD-10-CM | POA: Insufficient documentation

## 2023-11-26 NOTE — Progress Notes (Signed)
 [address],  [place], [place] [address]  Office: [phone]  /  Fax: [phone]   Subjective   Initial Visit  Patty Green (MR# [id number]) is a 43 y.o. female who presents for evaluation and treatment of obesity and related comorbidities. Current BMI is Body mass index is 32.37 kg/m. Meshawn has been struggling with her weight for many years and has been unsuccessful in either losing weight, maintaining weight loss, or reaching her healthy weight goal.  Maryela is currently in the action stage of change and ready to dedicate time achieving and maintaining a healthier weight. Bhumi is interested in becoming our patient and working on intensive lifestyle modifications including (but not limited to) diet and exercise for weight loss.  Madden went through a divorce in 2021 and lost weight and was able to keep it off until 2 years ago when she started to gradually gain weight and has not been able to lose weight. She has tried intermittent fasting, keto and portion control. She works at a [employer] which has 80 acres and she is very active at work getting 76999 steps a day. She does not do regular exercise other than work. She denies related health conditions.   She has previously been followed through hematology for erythrocytosis. She is under observation only for the condition.  Weight history:  When asked how their weight has affected their life and health, she states: Has affected self-esteem, Having fatigue, and Having poor endurance  When asked what else they would like to accomplish? She states: Adopt a healthier eating pattern and lifestyle, Improve energy levels and physical activity, Improve existing medical conditions, Improve quality of life, Improve appearance, and Improve self-confidence  She starting to note weight gain during : In the past 2 years with perimenopause.  Life events associated with weight gain include : single parent. .   Other contributing factors: use of  obesogenic medications: Contraceptives or hormonal therapy, moderate to high levels of stress, chronic skipping of meals, hectic pace of life, need for convenient foods, and self - critic or all-or-none mindset.  Their highest weight has been:  176 lbs.  Desired weight: 130  Previous weight-loss programs : Ketogenic, Balanced Plate / Portion Control, and Intermittent fasting.  Their maximum weight loss was:  30 lbs. After divorce  Their greatest challenge with dieting: no weight loss and dieting fatigue.  Current or previous pharmacotherapy: None and Is interested in pharmacotherapy.  Response to medication: Never tried medications   Nutritional History:  Current nutrition plan: None.  How many times do you eat outside the home: 2-4 per week  How often do they skip meals: skips breakfast, skips lunch, and skips dinner- depends on the day which meal she will skip  What beverages do they drink: water, diet soda , and alcohol( wine and beer, few glasses a week and on the weekend)   Use of artificial sweetners : No  Food intolerances or dislikes: fish and green peas  Food triggers: Stress and Boredom.  Food cravings: None  Do they struggle with excessive hunger or portion control : No    Physical Activity:  Current level of physical activity: Walking 5 days a week at work and Step counting 76999 at work  Barriers to Exercise: time and energy   Past medical history includes:   Past Medical History:  Diagnosis Date   Anxiety    Vitamin B 12 deficiency      Objective   BP 118/78   Pulse  78   Temp 98.1 F (36.7 C)   Ht 5' 2 (1.575 m)   Wt 177 lb (80.3 kg)   SpO2 98%   BMI 32.37 kg/m  She was weighed on the bioimpedance scale: Body mass index is 32.37 kg/m.    Anthropometrics:  Vitals Temp: 98.1 F (36.7 C) BP: 118/78 Pulse Rate: 78 SpO2: 98 %   Anthropometric Measurements Height: 5' 2 (1.575 m) Weight: 177 lb (80.3 kg) BMI (Calculated):  32.37 Peak Weight: 177 lb Waist Measurement : 37 inches   Body Composition  Body Fat %: 39.2 % Fat Mass (lbs): 69.9 lbs Muscle Mass (lbs): 102.4 lbs Total Body Water (lbs): 69.8 lbs Visceral Fat Rating : 9   Other Clinical Data Fasting: yes Labs: yes Today's Visit #: 1 Starting Date: 11/26/23    Physical Exam:  General: She is overweight, cooperative, alert, well developed, and in no acute distress. PSYCH: Has normal mood, affect and thought process.   HEENT: EOMI, sclerae are anicteric. Lungs: Normal breathing effort, no conversational dyspnea. Extremities: No edema.  Neurologic: No gross sensory or motor deficits. No tremors or fasciculations noted.    Diagnostic Data Reviewed  EKG: Normal sinus rhythm, rate 69. No conduction abnormalities, abnormal Q waves or chamber enlargement.  Indirect Calorimeter completed today shows a VO2 of 241 and a REE of 1656.  Her calculated basal metabolic rate is 8505 thus her resting energy expenditure same as calculated.  Depression Screen  Charolett's PHQ-9 score was: 10.     11/26/2023    8:10 AM  Depression screen PHQ 2/9  Decreased Interest 0  Down, Depressed, Hopeless 1  PHQ - 2 Score 1  Altered sleeping 2  Tired, decreased energy 3  Change in appetite 2  Feeling bad or failure about yourself  1  Trouble concentrating 1  Moving slowly or fidgety/restless 0  Suicidal thoughts 0  PHQ-9 Score 10  Difficult doing work/chores Not difficult at all    Screening for Sleep Related Breathing Disorders  Maebel admits to daytime somnolence and admits to waking up still tired. Patient has a history of symptoms of daytime fatigue. Juli generally gets 4-6 hours of sleep per night, and states that she has nightime awakenings and difficulty falling back asleep if awakened. Snoring is not present. Apneic episodes are not present. Epworth Sleepiness Score is 16.   BMET No results found for: NA, K, CL, CO2, GLUCOSE, BUN,  CREATININE, CALCIUM, GFRNONAA, GFRAA No results found for: HGBA1C No results found for: INSULIN CBC    Component Value Date/Time   WBC 6.6 07/30/2019 0735   WBC 9.4 03/12/2016 1454   WBC 13.1 (H) 04/11/2009 0555   RBC 5.60 (H) 07/30/2019 0735   HGB 17.7 (H) 07/30/2019 0735   HGB 15.1 03/12/2016 1454   HCT 52.5 (H) 07/30/2019 0735   HCT 42.9 03/12/2016 1454   PLT 238 07/30/2019 0735   PLT 235 03/12/2016 1454   MCV 93.8 07/30/2019 0735   MCV 87.9 03/12/2016 1454   MCH 31.6 07/30/2019 0735   MCHC 33.7 07/30/2019 0735   RDW 12.6 07/30/2019 0735   RDW 12.5 03/12/2016 1454   Iron/TIBC/Ferritin/ %Sat No results found for: IRON, TIBC, FERRITIN, IRONPCTSAT Lipid Panel  No results found for: CHOL, TRIG, HDL, CHOLHDL, VLDL, LDLCALC, LDLDIRECT Hepatic Function Panel  No results found for: PROT, ALBUMIN, AST, ALT, ALKPHOS, BILITOT, BILIDIR, IBILI No results found for: TSH   Assessment and Plan   TREATMENT PLAN FOR OBESITY:  Recommended Dietary  Goals  Anjelina is currently in the action stage of change. As such, her goal is to implement medically supervised obesity management plan.  She has agreed to implement: the Category 1 plan - 1000 kcal per day  Behavioral Intervention  We discussed the following Behavioral Modification Strategies today: increasing lean protein intake to established goals, decreasing simple carbohydrates , increasing vegetables, increasing lower glycemic fruits, increasing fiber rich foods, avoiding skipping meals, increasing water intake, work on meal planning and preparation, reading food labels , keeping healthy foods at home, identifying sources and decreasing liquid calories, decreasing eating out or consumption of processed foods, and making healthy choices when eating convenient foods, planning for success, and better snacking choices  Additional resources provided today: Handout on healthy eating and  balanced plate, Handout on complex carbohydrates and lean sources of protein, Category 1 packet, and Handout principles of weight management  Recommended Physical Activity Goals  Oza has been advised to work up to 150 minutes of moderate intensity aerobic activity a week and strengthening exercises 2-3 times per week for cardiovascular health, weight loss maintenance and preservation of muscle mass.   She has agreed to :  Continue current level of physical activity , Think about enjoyable ways to increase daily physical activity and overcoming barriers to exercise, and Increase physical activity in their day and reduce sedentary time (increase NEAT).  Medical Interventions and Pharmacotherapy We will work on building a therapist, art and behavioral strategies. We will discuss the role of pharmacotherapy as an adjunct at subsequent visits.   ASSOCIATED CONDITIONS ADDRESSED TODAY  Other Fatigue  Jnaya does feel that her weight is causing her energy to be lower than it should be. Fatigue may be related to obesity, depression or many other causes. Labs will be ordered, and in the meanwhile, Candita will focus on self care including making healthy food choices, increasing physical activity and focusing on stress reduction. - EKG 12 lead  Shortness of Breath Annjanette notes increasing shortness of breath with physical activity and seems to be worsening over time with weight gain. She notes getting out of breath sooner with activity than she used to. This has not gotten worse recently. Yeny denies shortness of breath at rest or orthopnea.   Depression screening Scored a 10 but does not feel depressed or feel the need for medication at this time  Vitamin D deficiency If Vit D level remains low will supplement with Ergocalciferol 50000 units once a week for 12 weeks and then recheck level.   -     VITAMIN D 25 Hydroxy (Vit-D Deficiency, Fractures)  Erythrocytosis(idiopathic) Recheck  CBC to make sure hemoglobin/hematocrit/RBC are not excessively elevated -     CBC with Differential/Platelet  Class 1 obesity without serious comorbidity with body mass index (BMI) of 32.0 to 32.9 in adult, unspecified obesity type See plan above -     CBC with Differential/Platelet -     Comprehensive metabolic panel with GFR -     Hemoglobin A1c -     Insulin, random -     Lipid panel -     Magnesium -     TSH -     Vitamin B12 -     VITAMIN D 25 Hydroxy (Vit-D Deficiency, Fractures)    Follow-up  She was informed of the importance of frequent follow-up visits to maximize her success with intensive lifestyle modifications for her multiple health conditions. She was informed we would discuss her lab results at her  next visit unless there is a critical issue that needs to be addressed sooner. Seriah agreed to keep her next visit at the agreed upon time to discuss these results.  Attestation Statement  This is the patient's intake visit at [facility]. The patient's Health Questionnaire was reviewed at length. Included in the packet: current and past health history, medications, allergies, ROS, gynecologic history (women only), surgical history, family history, social history, weight history, weight loss surgery history (for those that have had weight loss surgery), nutritional evaluation, mood and food questionnaire, PHQ9, Epworth questionnaire, sleep habits questionnaire, patient life and health improvement goals questionnaire. These will all be scanned into the patient's chart under media.   During the visit, I independently reviewed the patient's EKG, previous labs, bioimpedance scale results, and indirect calorimetry results. I used this information to medically tailor a meal plan for the patient that will help her to lose weight and will improve her obesity-related conditions. I performed a medically necessary appropriate examination and/or evaluation. I discussed the  assessment and treatment plan with the patient. The patient was provided an opportunity to ask questions and all were answered. The patient agreed with the plan and demonstrated an understanding of the instructions. Labs were ordered at this visit and will be reviewed at the next visit unless critical results need to be addressed immediately. Clinical information was updated and documented in the EMR.   In addition, they received basic education on identification of processed foods and reduction of these, different sources of lean proteins and complex carbohydrates and how to eat balanced by incorporation of whole foods.  Reviewed by clinician on day of visit: allergies, medications, problem list, medical history, surgical history, family history, social history, and previous encounter notes.  I personally spent a total of 40 minutes in the care of the patient today including preparing to see the patient, getting/reviewing separately obtained history, performing a medically appropriate exam/evaluation, counseling and educating, placing orders, and documenting clinical information in the EHR. Excluding time for indirect calorimetry, EKG and depression screening   Lonell Liverpool ANP-C

## 2023-11-27 ENCOUNTER — Ambulatory Visit (INDEPENDENT_AMBULATORY_CARE_PROVIDER_SITE_OTHER): Payer: Self-pay | Admitting: Nurse Practitioner

## 2023-11-27 LAB — LIPID PANEL
Chol/HDL Ratio: 4.9 ratio — ABNORMAL HIGH (ref 0.0–4.4)
Cholesterol, Total: 210 mg/dL — ABNORMAL HIGH (ref 100–199)
HDL: 43 mg/dL (ref >39)
LDL Chol Calc (NIH): 137 mg/dL — ABNORMAL HIGH (ref 0–99)
Triglycerides: 168 mg/dL — ABNORMAL HIGH (ref 0–149)
VLDL Cholesterol Cal: 30 mg/dL (ref 5–40)

## 2023-11-27 LAB — COMPREHENSIVE METABOLIC PANEL WITH GFR
ALT: 32 IU/L (ref 0–32)
AST: 56 IU/L — ABNORMAL HIGH (ref 0–40)
Albumin: 4.5 g/dL (ref 3.9–4.9)
Alkaline Phosphatase: 100 IU/L (ref 41–116)
BUN/Creatinine Ratio: 17 (ref 9–23)
BUN: 12 mg/dL (ref 6–24)
Bilirubin Total: 0.9 mg/dL (ref 0.0–1.2)
CO2: 24 mmol/L (ref 20–29)
Calcium: 9.7 mg/dL (ref 8.7–10.2)
Chloride: 101 mmol/L (ref 96–106)
Creatinine, Ser: 0.72 mg/dL (ref 0.57–1.00)
Globulin, Total: 2.5 g/dL (ref 1.5–4.5)
Glucose: 88 mg/dL (ref 70–99)
Potassium: 4.5 mmol/L (ref 3.5–5.2)
Sodium: 137 mmol/L (ref 134–144)
Total Protein: 7 g/dL (ref 6.0–8.5)
eGFR: 107 mL/min/1.73 (ref >59)

## 2023-11-27 LAB — HEMOGLOBIN A1C
Est. average glucose Bld gHb Est-mCnc: 105 mg/dL
Hgb A1c MFr Bld: 5.3 % (ref 4.8–5.6)

## 2023-11-27 LAB — CBC WITH DIFFERENTIAL/PLATELET
Basophils Absolute: 0 x10E3/uL (ref 0.0–0.2)
Basos: 1 %
EOS (ABSOLUTE): 0.2 x10E3/uL (ref 0.0–0.4)
Eos: 2 %
Hematocrit: 49.9 % — ABNORMAL HIGH (ref 34.0–46.6)
Hemoglobin: 16.3 g/dL — ABNORMAL HIGH (ref 11.1–15.9)
Immature Grans (Abs): 0.1 x10E3/uL (ref 0.0–0.1)
Immature Granulocytes: 1 %
Lymphocytes Absolute: 2 x10E3/uL (ref 0.7–3.1)
Lymphs: 23 %
MCH: 30.6 pg (ref 26.6–33.0)
MCHC: 32.7 g/dL (ref 31.5–35.7)
MCV: 94 fL (ref 79–97)
Monocytes Absolute: 0.4 x10E3/uL (ref 0.1–0.9)
Monocytes: 5 %
Neutrophils Absolute: 5.7 x10E3/uL (ref 1.4–7.0)
Neutrophils: 68 %
Platelets: 272 x10E3/uL (ref 150–450)
RBC: 5.32 x10E6/uL — ABNORMAL HIGH (ref 3.77–5.28)
RDW: 12 % (ref 11.7–15.4)
WBC: 8.4 x10E3/uL (ref 3.4–10.8)

## 2023-11-27 LAB — INSULIN, RANDOM: INSULIN: 12.8 u[IU]/mL (ref 2.6–24.9)

## 2023-11-27 LAB — VITAMIN B12: Vitamin B-12: 378 pg/mL (ref 232–1245)

## 2023-11-27 LAB — VITAMIN D 25 HYDROXY (VIT D DEFICIENCY, FRACTURES): Vit D, 25-Hydroxy: 19.2 ng/mL — ABNORMAL LOW (ref 30.0–100.0)

## 2023-11-27 LAB — TSH: TSH: 1.93 u[IU]/mL (ref 0.450–4.500)

## 2023-11-27 LAB — MAGNESIUM: Magnesium: 2.3 mg/dL (ref 1.6–2.3)

## 2023-12-10 ENCOUNTER — Encounter (INDEPENDENT_AMBULATORY_CARE_PROVIDER_SITE_OTHER): Payer: Self-pay | Admitting: Nurse Practitioner

## 2023-12-10 ENCOUNTER — Ambulatory Visit (INDEPENDENT_AMBULATORY_CARE_PROVIDER_SITE_OTHER): Payer: Self-pay | Admitting: Nurse Practitioner

## 2023-12-10 VITALS — BP 122/81 | HR 70 | Temp 98.1°F | Ht 62.0 in | Wt 175.0 lb

## 2023-12-10 DIAGNOSIS — E559 Vitamin D deficiency, unspecified: Secondary | ICD-10-CM

## 2023-12-10 DIAGNOSIS — E782 Mixed hyperlipidemia: Secondary | ICD-10-CM

## 2023-12-10 DIAGNOSIS — Z6832 Body mass index (BMI) 32.0-32.9, adult: Secondary | ICD-10-CM

## 2023-12-10 DIAGNOSIS — D751 Secondary polycythemia: Secondary | ICD-10-CM | POA: Diagnosis not present

## 2023-12-10 DIAGNOSIS — E66811 Obesity, class 1: Secondary | ICD-10-CM | POA: Diagnosis not present

## 2023-12-10 MED ORDER — VITAMIN D (ERGOCALCIFEROL) 1.25 MG (50000 UNIT) PO CAPS: 50000.0000 [IU] | ORAL_CAPSULE | ORAL | 1 refills | Status: DC

## 2023-12-10 NOTE — Progress Notes (Signed)
 Office: [phone]  /  Fax: [phone]  WEIGHT SUMMARY AND BIOMETRICS  Weight Lost Since Last Visit: 2 lb  Weight Gained Since Last Visit: 0   Vitals Temp: 98.1 F (36.7 C) BP: 122/81 Pulse Rate: 70 SpO2: 100 %   Anthropometric Measurements Height: 5' 2 (1.575 m) Weight: 175 lb (79.4 kg) BMI (Calculated): 32 Weight at Last Visit: 177 lb Weight Lost Since Last Visit: 2 lb Weight Gained Since Last Visit: 0 Starting Weight: 177 lb Total Weight Loss (lbs): 2 lb (0.907 kg) Peak Weight: 177 lb Waist Measurement : 37 inches   Body Composition  Body Fat %: 38.5 % Fat Mass (lbs): 67.6 lbs Muscle Mass (lbs): 102 lbs Total Body Water (lbs): 69.2 lbs Visceral Fat Rating : 8   Other Clinical Data Fasting: yes Labs: no Today's Visit #: 2 Starting Date: 11/26/23    Total Weight Loss: 2 pounds  Bio Impedance Data reviewed with patient: muscle mass is down 0.4 pounds, adipose is down 2.3 pounds. Visceral fat rating is down form 9 to 8  HPI  Chief Complaint: OBESITY  Patty Green is here to discuss her progress with her obesity treatment plan. She is on the the Category 1 Plan and states she is following her eating plan approximately 90 % of the time. She states she is not currently exercising   Interval History:  Since last office visit she has not been skipping meals , going 4 hours between meals. She is getting 72 ounces of water. She has been getting at least 80 grams of protein daily. She is getting fatigued with options on meal plan and asking for other suggestions.  She does plan to make a workout room in the house.  Thanksgiving is at her house- normal meal with all the fixings.     All labs are reviewed with patient in detail. Normal electrolytes, fasting glucose, Vit B12, A1c TSH, and kidney functions. She does have a history or erythrocytosis and RBC , hemoglobin and hematocrit were in her normal range. Lipid profile revealed mixed hyperlipidemia and she is  currently on no medication for treatment. Vit D deficiency is present and will benefit from supplementation. Mild elevation of fasting insulin  at 12.8 with HOMA-IR score of 2.78. She does have a mild elevation of AST opf 56 but ALK Phos and ALT are in normal range  Cancer screenings are reviewed with patient as obesity is a risk cancer for certain cancers.   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) repeat mammogram 09/10/23- negative  ASCVD risk is reviewed with patient as obesity is a risk factor for cardiovascular disease  The 10-year ASCVD risk score (Arnett DK, et al., 2019) is: 1%- low risk   Values used to calculate the score:     Age: 43 years     Clincally relevant sex: Female     Is Non-Hispanic African American: No     Diabetic: No     Tobacco smoker: No     Systolic Blood Pressure: 122 mmHg     Is BP treated: No     HDL Cholesterol: 43 mg/dL     Total Cholesterol: 210 mg/dL   PHYSICAL EXAM:  Blood pressure 122/81, pulse 70, temperature 98.1 F (36.7 C), height 5' 2 (1.575 m), weight 175 lb (79.4 kg), SpO2 100%. Body mass index is 32.01 kg/m.  General: Well Developed, well nourished, and in no acute distress.  HEENT: Normocephalic, atraumatic; EOMI, sclerae are anicteric. Skin: Warm and  dry, good turgor Chest:  Normal excursion, shape, no gross ABN Respiratory: No conversational dyspnea; speaking in full sentences NeuroM-Sk:  Normal gross ROM * 4 extremities  Psych: A and O X 3, insight adequate, mood- full    DIAGNOSTIC DATA REVIEWED:  Last metabolic panel Lab Results  Component Value Date   GLUCOSE 88 11/26/2023   NA 137 11/26/2023   K 4.5 11/26/2023   CL 101 11/26/2023   CO2 24 11/26/2023   BUN 12 11/26/2023   CREATININE 0.72 11/26/2023   EGFR 107 11/26/2023   CALCIUM 9.7 11/26/2023   PROT 7.0 11/26/2023   ALBUMIN 4.5 11/26/2023   LABGLOB 2.5 11/26/2023   BILITOT 0.9 11/26/2023   ALKPHOS 100  11/26/2023   AST 56 (H) 11/26/2023   ALT 32 11/26/2023     Lab Results  Component Value Date   HGBA1C 5.3 11/26/2023   Lab Results  Component Value Date   INSULIN 12.8 11/26/2023   Lab Results  Component Value Date   TSH 1.930 11/26/2023   CBC    Component Value Date/Time   WBC 8.4 11/26/2023 0838   WBC 6.6 07/30/2019 0735   WBC 9.4 03/12/2016 1454   WBC 13.1 (H) 04/11/2009 0555   RBC 5.32 (H) 11/26/2023 0838   RBC 5.60 (H) 07/30/2019 0735   HGB 16.3 (H) 11/26/2023 0838   HGB 15.1 03/12/2016 1454   HCT 49.9 (H) 11/26/2023 0838   HCT 42.9 03/12/2016 1454   PLT 272 11/26/2023 0838   MCV 94 11/26/2023 0838   MCV 87.9 03/12/2016 1454   MCH 30.6 11/26/2023 0838   MCH 31.6 07/30/2019 0735   MCHC 32.7 11/26/2023 0838   MCHC 33.7 07/30/2019 0735   RDW 12.0 11/26/2023 0838   RDW 12.5 03/12/2016 1454   Iron Studies No results found for: IRON, TIBC, FERRITIN, IRONPCTSAT Lipid Panel     Component Value Date/Time   CHOL 210 (H) 11/26/2023 0838   TRIG 168 (H) 11/26/2023 0838   HDL 43 11/26/2023 0838   CHOLHDL 4.9 (H) 11/26/2023 0838   LDLCALC 137 (H) 11/26/2023 0838    Nutritional Lab Results  Component Value Date   VD25OH 19.2 (L) 11/26/2023   Lab Results  Component Value Date   VITAMINB12 378 11/26/2023      ASSESSMENT AND PLAN  Class 1 obesity with serious comorbidity and body mass index (BMI) of 32.0 to 32.9 in adult, unspecified obesity type TREATMENT PLAN FOR OBESITY:  Recommended Dietary Goals  Patty Green is currently in the action stage of change. As such, her goal is to continue weight management plan. She has agreed to the Category 1 Plan.  Behavioral Intervention  We discussed the following Behavioral Modification Strategies today: work on meal planning and preparation, celebration eating strategies- one plate with protein as largest portion, clean vegetable and then portion of anything she wants to try but food cannot touch on the plate, be  mindful and enjoy your food  , continue to work on maintaining a reduced calorie state, getting the recommended amount of protein, incorporating whole foods, making healthy choices, staying well hydrated and practicing mindfulness when eating., and increase protein intake, fibrous foods (25 grams per day for women, 30 grams for men) and water to improve satiety and decrease hunger signals. .  Additional resources provided today: Reviewed skinny taste recipes and using CHATGPT for meal suggestions with calorie/protein parameters given per meal  Recommended Physical Activity Goals  Patty Green has been advised to work up to 150  minutes of moderate intensity aerobic activity a week and strengthening exercises 2-3 times per week for cardiovascular health, weight loss maintenance and preservation of muscle mass.   She has agreed to Think about enjoyable ways to increase daily physical activity and overcoming barriers to exercise, Increase physical activity in their day and reduce sedentary time (increase NEAT)., and Start aerobic activity with a goal of 150 minutes a week at moderate intensity.    Pharmacotherapy We discussed various medication options to help Patty Green with her weight loss efforts and we both agreed to start ergocalciferol 50000 units once a week for 12 weeks for Vit D Deficiency.  ASSOCIATED CONDITIONS ADDRESSED TODAY  Action/Plan  Vitamin D deficiency Supplement with Ergocalciferol 50000 units once a week for 12 weeks and then recheck level.   Vitamin d supplementation has been shown to decrease fatigue, decrease risk of progression to insulin resistance and then prediabetes, decreases risk of falling in older age and can even assist in decreasing depressive symptoms in PTSD   -     Vitamin D (Ergocalciferol); Take 1 capsule (50,000 Units total) by mouth every 7 (seven) days.  Dispense: 5 capsule; Refill: 1  Erythrocytosis       Will continue to monitor, has had previous workup and  idiopathic in nature  Mixed hyperlipidemia Focus on implementing category 1 meal plan, limit saturated fats- reviewed saturated fat foods Loss of 10-15% body weight can improve lipid levels Focus on getting 150 minutes a week of moderate to high intensity exercise          Return in about 4 weeks (around 01/07/2024).Patty Green She was informed of the importance of frequent follow up visits to maximize her success with intensive lifestyle modifications for her multiple health conditions.   ATTESTASTION STATEMENTS:  Reviewed by clinician on day of visit: allergies, medications, problem list, medical history, surgical history, family history, social history, and previous encounter notes.   I personally spent a total of 34 minutes in the care of the patient today including preparing to see the patient, getting/reviewing separately obtained history, performing a medically appropriate exam/evaluation, counseling and educating, placing orders, and documenting clinical information in the EHR.   Patty Green ANP-C

## 2024-01-06 ENCOUNTER — Encounter (INDEPENDENT_AMBULATORY_CARE_PROVIDER_SITE_OTHER): Payer: Self-pay | Admitting: Nurse Practitioner

## 2024-01-06 ENCOUNTER — Ambulatory Visit (INDEPENDENT_AMBULATORY_CARE_PROVIDER_SITE_OTHER): Admitting: Nurse Practitioner

## 2024-01-06 VITALS — BP 119/78 | HR 67 | Temp 97.9°F | Ht 62.0 in | Wt 174.0 lb

## 2024-01-06 DIAGNOSIS — E66811 Obesity, class 1: Secondary | ICD-10-CM

## 2024-01-06 DIAGNOSIS — E559 Vitamin D deficiency, unspecified: Secondary | ICD-10-CM | POA: Diagnosis not present

## 2024-01-06 DIAGNOSIS — E782 Mixed hyperlipidemia: Secondary | ICD-10-CM | POA: Diagnosis not present

## 2024-01-06 DIAGNOSIS — Z6831 Body mass index (BMI) 31.0-31.9, adult: Secondary | ICD-10-CM | POA: Diagnosis not present

## 2024-01-06 MED ORDER — VITAMIN D (ERGOCALCIFEROL) 1.25 MG (50000 UNIT) PO CAPS: 50000.0000 [IU] | ORAL_CAPSULE | ORAL | 1 refills | Status: DC

## 2024-01-06 NOTE — Progress Notes (Signed)
 Office: [phone]  /  Fax: [phone]  WEIGHT SUMMARY AND BIOMETRICS  Weight Lost Since Last Visit: 1 lb  Weight Gained Since Last Visit: 0   Vitals Temp: 97.9 F (36.6 C) BP: 119/78 Pulse Rate: 67 SpO2: 97 %   Anthropometric Measurements Height: 5' 2 (1.575 m) Weight: 174 lb (78.9 kg) BMI (Calculated): 31.82 Weight at Last Visit: 175 lb Weight Lost Since Last Visit: 1 lb Weight Gained Since Last Visit: 0 Starting Weight: 177 lb Total Weight Loss (lbs): 3 lb (1.361 kg) Peak Weight: 177 lb Waist Measurement : 37 inches   Body Composition  Body Fat %: 44.5 % Fat Mass (lbs): 77.4 lbs Muscle Mass (lbs): 91.6 lbs Total Body Water (lbs): 62.6 lbs Visceral Fat Rating : 10   Other Clinical Data Fasting: no Labs: no Today's Visit #: 3 Starting Date: 11/23/23    Total Weight Loss: 3 pounds   HPI  Chief Complaint: OBESITY  Patty Green is here to discuss her progress with her obesity treatment plan. She is on the the Category 1 Plan and states she is following her eating plan approximately 75 % of the time. She states she is exercising 30 minutes 3-4 days per week.   Interval History:  Since last office visit she has been getting 90 grams of protein.  She has been getting breakfast and lunch on plan. Dinner has been harder- has been skipping due to hectic lifestyle- has been doing a protein shake around 3:30-4 and then will not eat to morning. She is getting at least 90 ounces of water a day.  She does want to lose over Christmas. Her work has had a lot of candy and sweets brought in to the office. She has been taking her kids to parties and neighborhood parties.  She has been waking up at 2 am and 5 am- she falls asleep fine. Has tried some Magnesium but seen no effects as of yet.   Takeela was started on Ergocalciferol  50000 units once a week at last visit for Vit D deficiency and denies side effects She has been trying to decrease saturated fats and working on  nutrition plan, exercise and weight loss to help lower lipid levels.    PHYSICAL EXAM:  Blood pressure 119/78, pulse 67, temperature 97.9 F (36.6 C), height 5' 2 (1.575 m), weight 174 lb (78.9 kg), SpO2 97%. Body mass index is 31.83 kg/m.  General: Well Developed, well nourished, and in no acute distress.  HEENT: Normocephalic, atraumatic; EOMI, sclerae are anicteric. Skin: Warm and dry, good turgor Chest:  Normal excursion, shape, no gross ABN Respiratory: No conversational dyspnea; speaking in full sentences NeuroM-Sk:  Normal gross ROM * 4 extremities  Psych: A and O X 3, insight adequate, mood- full    DIAGNOSTIC DATA REVIEWED:  BMET    Component Value Date/Time   NA 137 11/26/2023 0838   K 4.5 11/26/2023 0838   CL 101 11/26/2023 0838   CO2 24 11/26/2023 0838   GLUCOSE 88 11/26/2023 0838   BUN 12 11/26/2023 0838   CREATININE 0.72 11/26/2023 0838   CALCIUM 9.7 11/26/2023 0838   Lab Results  Component Value Date   HGBA1C 5.3 11/26/2023   Lab Results  Component Value Date   INSULIN  12.8 11/26/2023   Lab Results  Component Value Date   TSH 1.930 11/26/2023   CBC    Component Value Date/Time   WBC 8.4 11/26/2023 0838   WBC 6.6 07/30/2019 0735   WBC 9.4 03/12/2016  1454   WBC 13.1 (H) 04/11/2009 0555   RBC 5.32 (H) 11/26/2023 0838   RBC 5.60 (H) 07/30/2019 0735   HGB 16.3 (H) 11/26/2023 0838   HGB 15.1 03/12/2016 1454   HCT 49.9 (H) 11/26/2023 0838   HCT 42.9 03/12/2016 1454   PLT 272 11/26/2023 0838   MCV 94 11/26/2023 0838   MCV 87.9 03/12/2016 1454   MCH 30.6 11/26/2023 0838   MCH 31.6 07/30/2019 0735   MCHC 32.7 11/26/2023 0838   MCHC 33.7 07/30/2019 0735   RDW 12.0 11/26/2023 0838   RDW 12.5 03/12/2016 1454   Iron Studies No results found for: IRON, TIBC, FERRITIN, IRONPCTSAT Lipid Panel     Component Value Date/Time   CHOL 210 (H) 11/26/2023 0838   TRIG 168 (H) 11/26/2023 0838   HDL 43 11/26/2023 0838   CHOLHDL 4.9 (H)  11/26/2023 0838   LDLCALC 137 (H) 11/26/2023 0838   Hepatic Function Panel     Component Value Date/Time   PROT 7.0 11/26/2023 0838   ALBUMIN 4.5 11/26/2023 0838   AST 56 (H) 11/26/2023 0838   ALT 32 11/26/2023 0838   ALKPHOS 100 11/26/2023 0838   BILITOT 0.9 11/26/2023 0838      Component Value Date/Time   TSH 1.930 11/26/2023 0838   Nutritional Lab Results  Component Value Date   VD25OH 19.2 (L) 11/26/2023     ASSESSMENT AND PLAN  Class 1 obesity with serious comorbidity and body mass index (BMI) of 31.0 to 31.9 in adult, unspecified obesity type  TREATMENT PLAN FOR OBESITY:  Recommended Dietary Goals  Patty Green is currently in the action stage of change. As such, her goal is to continue weight management plan. She has agreed to the Category 1 Plan.  Behavioral Intervention  We discussed the following Behavioral Modification Strategies today: better snacking choices, celebration eating strategies, continue to work on maintaining a reduced calorie state, getting the recommended amount of protein, incorporating whole foods, making healthy choices, staying well hydrated and practicing mindfulness when eating., and increase protein intake, fibrous foods (25 grams per day for women, 30 grams for men) and water to improve satiety and decrease hunger signals. . She has been having difficulty with nighttime awakenings- recommended use of CALM sleep.  She wants to lose weight in December - She does  recognize that she must follow a structured plan and will eat before social situation to meet her protein goals and minimze party foods - She will give away food gifts unless they are very low calories or high protein - She will avoid calorie-containing liquids such as holiday drinks, eggnog and alcohol -She will stick to her structured plan strictly most of the time - This strategy should help her lose 1-2 pounds in December   Recommended Physical Activity Goals  Patty Green has been  advised to work up to 150 minutes of moderate intensity aerobic activity a week and strengthening exercises 2-3 times per week for cardiovascular health, weight loss maintenance and preservation of muscle mass.   She has agreed to Continue current level of physical activity  and Combine aerobic and strengthening exercises for efficiency and improved cardiometabolic health.   Pharmacotherapy We discussed various medication options to help Karolina with her weight loss efforts and we both agreed to continue ergocalciferol  50000 units once a week for Vit D deficiency- denies side effects.  ASSOCIATED CONDITIONS ADDRESSED TODAY  Action/Plan  Vitamin D  deficiency Continue to supplement with Ergocalciferol  50000 units once a week  Low  vitamin D  levels can be associated with adiposity and may result in leptin resistance and weight gain. Also associated with fatigue.  Currently on vitamin D  supplementation without any adverse effects such as nausea, vomiting or muscle weakness.   -     Vitamin D  (Ergocalciferol ); Take 1 capsule (50,000 Units total) by mouth every 7 (seven) days.  Dispense: 5 capsule; Refill: 1  Mixed hyperlipidemia Focus on implementing category 1 meal plan, limit saturated fats. Increase protein/fiber/water Continue to follow regularly with PCP Focus on getting 150 minutes a week of moderate to high intensity exercise      Return in about 4 weeks (around 02/03/2024).SABRA She was informed of the importance of frequent follow up visits to maximize her success with intensive lifestyle modifications for her multiple health conditions.   ATTESTASTION STATEMENTS:  Reviewed by clinician on day of visit: allergies, medications, problem list, medical history, surgical history, family history, social history, and previous encounter notes.    Kourtnie Sachs ANP-C

## 2024-02-03 ENCOUNTER — Ambulatory Visit (INDEPENDENT_AMBULATORY_CARE_PROVIDER_SITE_OTHER): Admitting: Nurse Practitioner

## 2024-02-17 ENCOUNTER — Ambulatory Visit (INDEPENDENT_AMBULATORY_CARE_PROVIDER_SITE_OTHER): Admitting: Nurse Practitioner

## 2024-02-19 ENCOUNTER — Ambulatory Visit (INDEPENDENT_AMBULATORY_CARE_PROVIDER_SITE_OTHER): Admitting: Nurse Practitioner

## 2024-02-23 ENCOUNTER — Ambulatory Visit (INDEPENDENT_AMBULATORY_CARE_PROVIDER_SITE_OTHER): Admitting: Nurse Practitioner

## 2024-02-25 ENCOUNTER — Encounter (INDEPENDENT_AMBULATORY_CARE_PROVIDER_SITE_OTHER): Payer: Self-pay | Admitting: Nurse Practitioner

## 2024-02-25 ENCOUNTER — Ambulatory Visit (INDEPENDENT_AMBULATORY_CARE_PROVIDER_SITE_OTHER): Admitting: Nurse Practitioner

## 2024-02-25 VITALS — BP 113/80 | HR 73 | Temp 97.8°F | Ht 62.0 in | Wt 175.0 lb

## 2024-02-25 DIAGNOSIS — E559 Vitamin D deficiency, unspecified: Secondary | ICD-10-CM

## 2024-02-25 DIAGNOSIS — E66811 Obesity, class 1: Secondary | ICD-10-CM | POA: Diagnosis not present

## 2024-02-25 DIAGNOSIS — E782 Mixed hyperlipidemia: Secondary | ICD-10-CM | POA: Diagnosis not present

## 2024-02-25 DIAGNOSIS — Z6831 Body mass index (BMI) 31.0-31.9, adult: Secondary | ICD-10-CM | POA: Diagnosis not present

## 2024-02-25 MED ORDER — VITAMIN D (ERGOCALCIFEROL) 1.25 MG (50000 UNIT) PO CAPS: 50000.0000 [IU] | ORAL_CAPSULE | ORAL | 0 refills | Status: AC

## 2024-02-25 NOTE — Progress Notes (Signed)
 " Office: [phone]  /  Fax: [phone]  WEIGHT SUMMARY AND BIOMETRICS  Weight Lost Since Last Visit: 0lb  Weight Gained Since Last Visit: 1lb   Vitals Temp: 97.8 F (36.6 C) BP: 113/80 Pulse Rate: 73 SpO2: 97 %   Anthropometric Measurements Height: 5' 2 (1.575 m) Weight: 175 lb (79.4 kg) BMI (Calculated): 32 Weight at Last Visit: 174lb Weight Lost Since Last Visit: 0lb Weight Gained Since Last Visit: 1lb Starting Weight: 177lb Total Weight Loss (lbs): 2 lb (0.907 kg) Peak Weight: 177lb   Body Composition  Body Fat %: 38.6 % Fat Mass (lbs): 67.6 lbs Muscle Mass (lbs): 102.2 lbs Total Body Water (lbs): 68.4 lbs Visceral Fat Rating : 8   Other Clinical Data Fasting: yes Labs: No Today's Visit #: 4 Starting Date: 11/23/23    Total Weight Loss: 2 pounds  Bio Impedance Data reviewed with patient: Muscle is up 0.2 pounds, adipose is the same from 12/10/23 visit. 01/06/24 visit was not accurate  HPI  Chief Complaint: OBESITY  Patty Green is here to discuss her progress with her obesity treatment plan. She is on the the Category 1 Plan and states she is following her eating plan approximately 50 % of the time. She states she is not currently exercising.   Interval History:  Since last office visit she was away for 2 weeks to [facility] for a trade show. She also had snow and was home.  Has not been able to exercise as much.  Breakfast was overnight oats or yogurt parfait Lunch was chicken nuggets or pizza Dinner was eating out and alcohol Now that she is back home she has got back on plan- getting 80 grams of protein at least. She is drinking more water.   Continues on Ergocalciferol  50000 units once a week and denies side effects. Continues to follow meal plan and work on weight loss to help improve lipid levels.     PHYSICAL EXAM:  Blood pressure 113/80, pulse 73, temperature 97.8 F (36.6 C), height 5' 2 (1.575 m), weight 175 lb (79.4 kg), SpO2  97%. Body mass index is 32.01 kg/m.  General: Well Developed, well nourished, and in no acute distress.  HEENT: Normocephalic, atraumatic; EOMI, sclerae are anicteric. Skin: Warm and dry, good turgor Chest:  Normal excursion, shape, no gross ABN Respiratory: No conversational dyspnea; speaking in full sentences NeuroM-Sk:  Normal gross ROM * 4 extremities  Psych: A and O X 3, insight adequate, mood- full    DIAGNOSTIC DATA REVIEWED:  BMET    Component Value Date/Time   NA 137 11/26/2023 0838   K 4.5 11/26/2023 0838   CL 101 11/26/2023 0838   CO2 24 11/26/2023 0838   GLUCOSE 88 11/26/2023 0838   BUN 12 11/26/2023 0838   CREATININE 0.72 11/26/2023 0838   CALCIUM 9.7 11/26/2023 0838   Lab Results  Component Value Date   HGBA1C 5.3 11/26/2023   Lab Results  Component Value Date   INSULIN  12.8 11/26/2023   Lab Results  Component Value Date   TSH 1.930 11/26/2023   CBC    Component Value Date/Time   WBC 8.4 11/26/2023 0838   WBC 6.6 07/30/2019 0735   WBC 9.4 03/12/2016 1454   WBC 13.1 (H) 04/11/2009 0555   RBC 5.32 (H) 11/26/2023 0838   RBC 5.60 (H) 07/30/2019 0735   HGB 16.3 (H) 11/26/2023 0838   HGB 15.1 03/12/2016 1454   HCT 49.9 (H) 11/26/2023 0838   HCT 42.9 03/12/2016 1454  PLT 272 11/26/2023 0838   MCV 94 11/26/2023 0838   MCV 87.9 03/12/2016 1454   MCH 30.6 11/26/2023 0838   MCH 31.6 07/30/2019 0735   MCHC 32.7 11/26/2023 0838   MCHC 33.7 07/30/2019 0735   RDW 12.0 11/26/2023 0838   RDW 12.5 03/12/2016 1454   Iron Studies No results found for: IRON, TIBC, FERRITIN, IRONPCTSAT Lipid Panel     Component Value Date/Time   CHOL 210 (H) 11/26/2023 0838   TRIG 168 (H) 11/26/2023 0838   HDL 43 11/26/2023 0838   CHOLHDL 4.9 (H) 11/26/2023 0838   LDLCALC 137 (H) 11/26/2023 0838   Hepatic Function Panel     Component Value Date/Time   PROT 7.0 11/26/2023 0838   ALBUMIN 4.5 11/26/2023 0838   AST 56 (H) 11/26/2023 0838   ALT 32 11/26/2023  0838   ALKPHOS 100 11/26/2023 0838   BILITOT 0.9 11/26/2023 0838      Component Value Date/Time   TSH 1.930 11/26/2023 0838   Nutritional Lab Results  Component Value Date   VD25OH 19.2 (L) 11/26/2023     ASSESSMENT AND PLAN Class 1 obesity with serious comorbidity and body mass index (BMI) of 31.0 to 31.9 in adult, unspecified obesity type TREATMENT PLAN FOR OBESITY:  Recommended Dietary Goals  Patty Green is currently in the action stage of change. As such, her goal is to continue weight management plan. She has agreed to the Category 1 Plan.  Behavioral Intervention  We discussed the following Behavioral Modification Strategies today: continue to work on maintaining a reduced calorie state, getting the recommended amount of protein, incorporating whole foods, making healthy choices, staying well hydrated and practicing mindfulness when eating., increase protein intake, fibrous foods (25 grams per day for women, 30 grams for men) and water to improve satiety and decrease hunger signals. , and getting back on track after recent lapse.   Recommended Physical Activity Goals  Patty Green has been advised to work up to 150 minutes of moderate intensity aerobic activity a week and strengthening exercises 2-3 times per week for cardiovascular health, weight loss maintenance and preservation of muscle mass.   She has agreed to Increase the intensity, frequency or duration of aerobic exercises   and Combine aerobic and strengthening exercises for efficiency and improved cardiometabolic health.   Pharmacotherapy We discussed various medication options to help Patty Green with her weight loss efforts and we both agreed to continue nutrition and behavior modification. Continue Ergocalciferol  50000 units once a week for Vit D deficiency- denies side effect.  ASSOCIATED CONDITIONS ADDRESSED TODAY  Action/Plan  Vitamin D  deficiency Low vitamin D  levels can be associated with adiposity and may result in  leptin resistance and weight gain. Also associated with fatigue.  Currently on vitamin D  supplementation without any adverse effects such as nausea, vomiting or muscle weakness.   -     Vitamin D  (Ergocalciferol ); Take 1 capsule (50,000 Units total) by mouth every 7 (seven) days.  Dispense: 5 capsule; Refill: 0  Mixed hyperlipidemia Continue category 1 meal plan, limit saturated fats. Increase lean protein, water and fiber Focus on getting 150 minutes a week of moderate to high intensity exercise with strength training.          Return in about 4 weeks (around 03/24/2024).Patty Green She was informed of the importance of frequent follow up visits to maximize her success with intensive lifestyle modifications for her multiple health conditions.   ATTESTASTION STATEMENTS:  Reviewed by clinician on day of visit: allergies, medications,  problem list, medical history, surgical history, family history, social history, and previous encounter notes.      Patty Green ANP-C "

## 2024-03-25 ENCOUNTER — Ambulatory Visit (INDEPENDENT_AMBULATORY_CARE_PROVIDER_SITE_OTHER): Admitting: Nurse Practitioner
